# Patient Record
Sex: Male | Born: 1990 | Race: White | Hispanic: No | Marital: Single | Smoking: Current every day smoker
Health system: Southern US, Community
[De-identification: ages and names within clinical notes are randomized; demographics above are authoritative.]

## PROBLEM LIST (undated history)

## (undated) DIAGNOSIS — Z789 Other specified health status: Secondary | ICD-10-CM

## (undated) HISTORY — PX: APPENDECTOMY: SHX54

---

## 2012-09-20 ENCOUNTER — Emergency Department: Payer: Self-pay | Admitting: Emergency Medicine

## 2013-11-02 ENCOUNTER — Emergency Department: Payer: Self-pay | Admitting: Emergency Medicine

## 2018-09-19 ENCOUNTER — Inpatient Hospital Stay
Admission: EM | Admit: 2018-09-19 | Discharge: 2018-09-21 | DRG: 603 | Disposition: A | Discharge status: Home / Self Care | Payer: Self-pay | Attending: Internal Medicine | Admitting: Internal Medicine

## 2018-09-19 ENCOUNTER — Other Ambulatory Visit: Payer: Self-pay

## 2018-09-19 ENCOUNTER — Emergency Department: Payer: Self-pay

## 2018-09-19 ENCOUNTER — Encounter: Payer: Self-pay | Admitting: Emergency Medicine

## 2018-09-19 DIAGNOSIS — E872 Acidosis: Secondary | ICD-10-CM | POA: Diagnosis present

## 2018-09-19 DIAGNOSIS — Z20828 Contact with and (suspected) exposure to other viral communicable diseases: Secondary | ICD-10-CM | POA: Diagnosis present

## 2018-09-19 DIAGNOSIS — R739 Hyperglycemia, unspecified: Secondary | ICD-10-CM

## 2018-09-19 DIAGNOSIS — E781 Pure hyperglyceridemia: Secondary | ICD-10-CM | POA: Diagnosis present

## 2018-09-19 DIAGNOSIS — Z833 Family history of diabetes mellitus: Secondary | ICD-10-CM

## 2018-09-19 DIAGNOSIS — L03113 Cellulitis of right upper limb: Principal | ICD-10-CM | POA: Diagnosis present

## 2018-09-19 DIAGNOSIS — F17200 Nicotine dependence, unspecified, uncomplicated: Secondary | ICD-10-CM | POA: Diagnosis present

## 2018-09-19 HISTORY — DX: Other specified health status: Z78.9

## 2018-09-19 LAB — COMPREHENSIVE METABOLIC PANEL
ALT: 49 U/L — ABNORMAL HIGH (ref 0–44)
AST: 33 U/L (ref 15–41)
Albumin: 4.2 g/dL (ref 3.5–5.0)
Alkaline Phosphatase: 79 U/L (ref 38–126)
Anion gap: 16 — ABNORMAL HIGH (ref 5–15)
BUN: 15 mg/dL (ref 6–20)
CO2: 19 mmol/L — ABNORMAL LOW (ref 22–32)
Calcium: 9.3 mg/dL (ref 8.9–10.3)
Chloride: 101 mmol/L (ref 98–111)
Creatinine, Ser: 1.17 mg/dL (ref 0.61–1.24)
GFR calc Af Amer: >60 mL/min (ref >60)
GFR calc non Af Amer: >60 mL/min (ref >60)
Glucose, Bld: 209 mg/dL — ABNORMAL HIGH (ref 70–99)
Potassium: 4.3 mmol/L (ref 3.5–5.1)
Sodium: UNDETERMINED mmol/L (ref 135–145)
Total Bilirubin: 0.5 mg/dL (ref 0.3–1.2)
Total Protein: UNDETERMINED g/dL (ref 6.5–8.1)

## 2018-09-19 LAB — CBC WITH DIFFERENTIAL/PLATELET
Abs Immature Granulocytes: 0.02 10*3/uL (ref 0.00–0.07)
Basophils Absolute: 0.1 10*3/uL (ref 0.0–0.1)
Basophils Relative: 1 %
Eosinophils Absolute: 0.3 10*3/uL (ref 0.0–0.5)
Eosinophils Relative: 3 %
HCT: 44.7 % (ref 39.0–52.0)
Hemoglobin: 15.7 g/dL (ref 13.0–17.0)
Immature Granulocytes: 0 %
Lymphocytes Relative: 42 %
Lymphs Abs: 3.5 10*3/uL (ref 0.7–4.0)
MCH: 32.3 pg (ref 26.0–34.0)
MCHC: 35.1 g/dL (ref 30.0–36.0)
MCV: 92 fL (ref 80.0–100.0)
Monocytes Absolute: 0.6 10*3/uL (ref 0.1–1.0)
Monocytes Relative: 7 %
Neutro Abs: 3.9 10*3/uL (ref 1.7–7.7)
Neutrophils Relative %: 47 %
Platelets: 244 10*3/uL (ref 150–400)
RBC: 4.86 MIL/uL (ref 4.22–5.81)
RDW: 12.2 % (ref 11.5–15.5)
WBC: 8.3 10*3/uL (ref 4.0–10.5)
nRBC: 0 % (ref 0.0–0.2)

## 2018-09-19 LAB — LACTIC ACID, PLASMA
Lactic Acid, Venous: 2.6 mmol/L (ref 0.5–1.9)
Lactic Acid, Venous: 1.6 mmol/L (ref 0.5–1.9)

## 2018-09-19 MED ORDER — SODIUM CHLORIDE 0.9 % IV SOLN
2.0000 g | Freq: Once | INTRAVENOUS | Status: AC
  Administered 2018-09-19: 2 g via INTRAVENOUS
  Filled 2018-09-19: qty 20

## 2018-09-19 MED ORDER — KETOROLAC TROMETHAMINE 30 MG/ML IJ SOLN
15.0000 mg | Freq: Once | INTRAMUSCULAR | Status: AC
  Administered 2018-09-19: 15 mg via INTRAVENOUS
  Filled 2018-09-19: qty 1

## 2018-09-19 MED ORDER — HYDROMORPHONE HCL 1 MG/ML IJ SOLN
1.0000 mg | Freq: Once | INTRAMUSCULAR | Status: AC
  Administered 2018-09-19: 1 mg via INTRAVENOUS
  Filled 2018-09-19: qty 1

## 2018-09-19 MED ORDER — VANCOMYCIN HCL 10 G IV SOLR
2500.0000 mg | Freq: Once | INTRAVENOUS | Status: AC
  Administered 2018-09-19: 2500 mg via INTRAVENOUS
  Filled 2018-09-19: qty 2500

## 2018-09-19 MED ORDER — SODIUM CHLORIDE 0.9 % IV BOLUS
1000.0000 mL | Freq: Once | INTRAVENOUS | Status: AC
  Administered 2018-09-20: 1000 mL via INTRAVENOUS

## 2018-09-19 MED ORDER — LIDOCAINE-EPINEPHRINE 1 %-1:100000 IJ SOLN
10.0000 mL | Freq: Once | INTRAMUSCULAR | Status: AC
  Administered 2018-09-19: 10 mL via INTRADERMAL
  Filled 2018-09-19: qty 1

## 2018-09-19 NOTE — H&P (Signed)
[facility] [place] [facility]   PATIENT NAME: James Morgan    MR#:  [id number]  DATE OF BIRTH:  01/22/1991  DATE OF ADMISSION:  09/19/2018  PRIMARY CARE PHYSICIAN: Patient, No Pcp Per   REQUESTING/REFERRING PHYSICIAN: Erma HeritageIsaacs, MD  CHIEF COMPLAINT:  Right arm pain and swelling  HISTORY OF PRESENT ILLNESS:  James Morgan  is a 28 y.o. male who presents with chief complaint as above.  Patient presents the ED with 3 days of increasing pain at his elbow and swelling with erythema.  He states that just prior to his symptoms starting he was changing the oil in his car, and scraped his elbow.  He states that scabbed over and was fine for a day or 2, and then he started having some pain at that point, and then some erythema, and then some swelling.  Here in the ED he has noted cellulitis in the elbow  PAST MEDICAL HISTORY:   Past Medical History:  Diagnosis Date  . Patient denies medical problems      PAST SURGICAL HISTORY:   Past Surgical History:  Procedure Laterality Date  . APPENDECTOMY       SOCIAL HISTORY:   Social History   Tobacco Use  . Smoking status: Current Every Day Smoker  . Smokeless tobacco: Never Used  Substance Use Topics  . Alcohol use: Never    Frequency: Never     FAMILY HISTORY:    Family history reviewed and is non-contributory DRUG ALLERGIES:  No Known Allergies  MEDICATIONS AT HOME:   Prior to Admission medications   Not on File    REVIEW OF SYSTEMS:  Review of Systems  Constitutional: Negative for chills, fever, malaise/fatigue and weight loss.  HENT: Negative for ear pain, hearing loss and tinnitus.   Eyes: Negative for blurred vision, double vision, pain and redness.  Respiratory: Negative for cough, hemoptysis and shortness of breath.   Cardiovascular: Negative for chest pain, palpitations, orthopnea and leg swelling.  Gastrointestinal: Negative for abdominal pain, constipation, diarrhea, nausea  and vomiting.  Genitourinary: Negative for dysuria, frequency and hematuria.  Musculoskeletal: Negative for back pain, joint pain and neck pain.  Skin:       Right elbow and arm swelling with erythema  Neurological: Negative for dizziness, tremors, focal weakness and weakness.  Endo/Heme/Allergies: Negative for polydipsia. Does not bruise/bleed easily.  Psychiatric/Behavioral: Negative for depression. The patient is not nervous/anxious and does not have insomnia.      VITAL SIGNS:   Vitals:   09/19/18 1913 09/19/18 1914 09/19/18 2200  BP: (!) 149/81  (!) 172/94  Pulse: 71  65  Resp: 18  16  Temp: 98.3 F (36.8 C)    TempSrc: Oral    SpO2: 97%  97%  Weight:  127 kg   Height:  6\' 5"  (1.956 m)    Wt Readings from Last 3 Encounters:  09/19/18 127 kg    PHYSICAL EXAMINATION:  Physical Exam  Vitals reviewed. Constitutional: He is oriented to person, place, and time. He appears well-developed and well-nourished. No distress.  HENT:  Head: Normocephalic and atraumatic.  Mouth/Throat: Oropharynx is clear and moist.  Eyes: Pupils are equal, round, and reactive to light. Conjunctivae and EOM are normal. No scleral icterus.  Neck: Normal range of motion. Neck supple. No JVD present. No thyromegaly present.  Cardiovascular: Normal rate, regular rhythm and intact distal pulses. Exam reveals no gallop and no friction rub.  No murmur heard. Respiratory: Effort normal  and breath sounds normal. No respiratory distress. He has no wheezes. He has no rales.  GI: Soft. Bowel sounds are normal. He exhibits no distension. There is no abdominal tenderness.  Musculoskeletal: Normal range of motion.        General: No edema.     Comments: No arthritis, no gout  Lymphadenopathy:    He has no cervical adenopathy.  Neurological: He is alert and oriented to person, place, and time. No cranial nerve deficit.  No dysarthria, no aphasia  Skin: Skin is warm and dry. No rash noted. There is erythema  (Focused around his right elbow, with streaky spread up and down his arm, with swelling).  Psychiatric: He has a normal mood and affect. His behavior is normal. Judgment and thought content normal.    LABORATORY PANEL:   CBC Recent Labs  Lab 09/19/18 1921  WBC 8.3  HGB 15.7  HCT 44.7  PLT 244   ------------------------------------------------------------------------------------------------------------------  Chemistries  Recent Labs  Lab 09/19/18 1921  NA UNABLE TO REPORT DUE TO LIPEMIC INTERFERENCE  K 4.3  CL 101  CO2 19*  GLUCOSE 209*  BUN 15  CREATININE 1.17  CALCIUM 9.3  AST 33  ALT 49*  ALKPHOS 79  BILITOT 0.5   ------------------------------------------------------------------------------------------------------------------  Cardiac Enzymes No results for input(s): TROPONINI in the last 168 hours. ------------------------------------------------------------------------------------------------------------------  RADIOLOGY:  Dg Elbow Complete Right  Result Date: 09/19/2018 CLINICAL DATA:  Swelling and elbow pain EXAM: RIGHT ELBOW - COMPLETE 3+ VIEW COMPARISON:  None. FINDINGS: There is no evidence of fracture, dislocation, or joint effusion. There is no evidence of arthropathy or other focal bone abnormality. Soft tissue swelling over the posterior elbow. IMPRESSION: No acute osseous abnormality Electronically Signed   By: Donavan Foil M.D.   On: 09/19/2018 20:02    EKG:  No orders found for this or any previous visit.  IMPRESSION AND PLAN:  Principal Problem:   Right arm cellulitis -IV antibiotics initiated, orthopedic surgery consult to evaluate for any need for drainage or other intervention   Chart review performed and case discussed with ED provider. Labs, imaging and/or ECG reviewed by provider and discussed with patient/family. Management plans discussed with the patient and/or family.  COVID-19 status: Pending  DVT PROPHYLAXIS: SubQ lovenox    GI PROPHYLAXIS:  None  ADMISSION STATUS: Inpatient     CODE STATUS: Full  TOTAL TIME TAKING CARE OF THIS PATIENT: 45 minutes.   This patient was evaluated in the context of the global COVID-19 pandemic, which necessitated consideration that the patient might be at risk for infection with the SARS-CoV-2 virus that causes COVID-19. Institutional protocols and algorithms that pertain to the evaluation of patients at risk for COVID-19 are in a state of rapid change based on information released by regulatory bodies including the CDC and federal and state organizations. These policies and algorithms were followed to the best of this provider's knowledge to date during the patient's care at this facility.  Ethlyn Daniels 09/19/2018, 11:29 PM  [facility]  Office  [phone]  CC: Primary care physician; Patient, No Pcp Per  Note:  This document was prepared using Dragon voice recognition software and may include unintentional dictation errors.

## 2018-09-19 NOTE — Progress Notes (Signed)
PHARMACY -  BRIEF ANTIBIOTIC NOTE   Pharmacy has received consult(s) for Vancomycin from an ED provider.  The patient's profile has been reviewed for ht/wt/allergies/indication/available labs.    One time order(s) placed for Vancomycin 2 gm IV X 1.   Further antibiotics/pharmacy consults should be ordered by admitting physician if indicated.                       Thank you, Elliannah Wayment D 09/19/2018  8:55 PM

## 2018-09-19 NOTE — ED Provider Notes (Signed)
[facility] Emergency Department Provider Note  ____________________________________________   First MD Initiated Contact with Patient 09/19/18 2039     (approximate)  I have reviewed the triage vital signs and the nursing notes.   HISTORY  Chief Complaint No chief complaint on file.    HPI James Morgan is a 28 y.o. male  Here with R elbow pain.  Patient states that he hit his elbow while working on his car 2 to 3 weeks ago.  He initially had some mild pain that had resolved.  However, over the last week, he is developed progressively worsening redness, pain, and swelling of his right elbow.  He said difficulty moving his elbow due to this pain.  He said subjective chills.  He states the pain is rapidly spreading up his arm, and now it is severe.  He denies any drainage.  No numbness or weakness.  No history of MRSA or previous infections.  Does have a strong family history of diabetes but has no known personal history of diabetes.  Pain is worse with movement and palpation.       Past Medical History:  Diagnosis Date   Patient denies medical problems     Patient Active Problem List   Diagnosis Date Noted   Right arm cellulitis 09/19/2018    Past Surgical History:  Procedure Laterality Date   APPENDECTOMY      Prior to Admission medications   Not on File    Allergies Patient has no known allergies.  No family history on file.  Social History Social History   Tobacco Use   Smoking status: Current Every Day Smoker   Smokeless tobacco: Never Used  Substance Use Topics   Alcohol use: Never    Frequency: Never   Drug use: Never    Review of Systems  Review of Systems  Constitutional: Positive for fatigue. Negative for chills and fever.  HENT: Negative for sore throat.   Respiratory: Negative for shortness of breath.   Cardiovascular: Negative for chest pain.  Gastrointestinal: Negative for abdominal pain.    Genitourinary: Negative for flank pain.  Musculoskeletal: Positive for arthralgias and joint swelling. Negative for neck pain.  Skin: Positive for rash. Negative for wound.  Allergic/Immunologic: Negative for immunocompromised state.  Neurological: Negative for weakness and numbness.  Hematological: Does not bruise/bleed easily.  All other systems reviewed and are negative.    ____________________________________________  PHYSICAL EXAM:      VITAL SIGNS: ED Triage Vitals  Enc Vitals Group     BP 09/19/18 1913 (!) 149/81     Pulse Rate 09/19/18 1913 71     Resp 09/19/18 1913 18     Temp 09/19/18 1913 98.3 F (36.8 C)     Temp Source 09/19/18 1913 Oral     SpO2 09/19/18 1913 97 %     Weight 09/19/18 1914 280 lb (127 kg)     Height 09/19/18 1914 6\' 5"  (1.956 m)     Head Circumference --      Peak Flow --      Pain Score 09/19/18 1914 7     Pain Loc --      Pain Edu? --      Excl. in GC? --      Physical Exam Vitals signs and nursing note reviewed.  Constitutional:      General: He is not in acute distress.    Appearance: He is well-developed.  HENT:     Head:  Normocephalic and atraumatic.  Eyes:     Conjunctiva/sclera: Conjunctivae normal.  Neck:     Musculoskeletal: Neck supple.  Cardiovascular:     Rate and Rhythm: Normal rate and regular rhythm.     Heart sounds: Normal heart sounds. No murmur. No friction rub.  Pulmonary:     Effort: Pulmonary effort is normal. No respiratory distress.     Breath sounds: Normal breath sounds. No wheezing or rales.  Abdominal:     General: There is no distension.     Palpations: Abdomen is soft.     Tenderness: There is no abdominal tenderness.  Skin:    General: Skin is warm.     Capillary Refill: Capillary refill takes less than 2 seconds.  Neurological:     Mental Status: He is alert and oriented to person, place, and time.     Motor: No abnormal muscle tone.      UPPER EXTREMITY EXAM: RIGHT  INSPECTION &  PALPATION: Significant warmth and erythema with induration extending across the olecranon and over the elbow.  There is moderate swelling and fluctuance here.  Passive range of motion of the elbow is relatively painless, until full extension.  No drainage.  Superficial punctate wound over the elbow.  SENSORY: Sensation is intact to light touch in:  Superficial radial nerve distribution (dorsal first web space) Median nerve distribution (tip of index finger)   Ulnar nerve distribution (tip of small finger)     MOTOR:  + Motor posterior interosseous nerve (thumb IP extension) + Anterior interosseous nerve (thumb IP flexion, index finger DIP flexion) + Radial nerve (wrist extension) + Median nerve (palpable firing thenar mass) + Ulnar nerve (palpable firing of first dorsal interosseous muscle)  VASCULAR: 2+ radial pulse Brisk capillary refill < 2 sec, fingers warm and well-perfused   ____________________________________________   LABS (all labs ordered are listed, but only abnormal results are displayed)  Labs Reviewed  COMPREHENSIVE METABOLIC PANEL - Abnormal; Notable for the following components:      Result Value   CO2 19 (*)    Glucose, Bld 209 (*)    ALT 49 (*)    Anion gap 16 (*)    All other components within normal limits  LACTIC ACID, PLASMA - Abnormal; Notable for the following components:   Lactic Acid, Venous 2.6 (*)    All other components within normal limits  LIPID PANEL - Abnormal; Notable for the following components:   Cholesterol 270 (*)    Triglycerides 1,459 (*)    All other components within normal limits  TRIGLYCERIDES - Abnormal; Notable for the following components:   Triglycerides 1,459 (*)    All other components within normal limits  CULTURE, BLOOD (ROUTINE X 2)  CULTURE, BLOOD (ROUTINE X 2)  NOVEL CORONAVIRUS, NAA (HOSP ORDER, SEND-OUT TO REF LAB; TAT 18-24 HRS)  CBC WITH DIFFERENTIAL/PLATELET  LACTIC ACID, PLASMA  LDL CHOLESTEROL, DIRECT    HEMOGLOBIN A1C  HIV ANTIBODY (ROUTINE TESTING W REFLEX)  BASIC METABOLIC PANEL  CBC    ____________________________________________  EKG: None ________________________________________  RADIOLOGY All imaging, including plain films, CT scans, and ultrasounds, independently reviewed by me, and interpretations confirmed via formal radiology reads.  ED MD interpretation:   XR Elbow: No acute fx, no free air  Official radiology report(s): Dg Elbow Complete Right  Result Date: 09/19/2018 CLINICAL DATA:  Swelling and elbow pain EXAM: RIGHT ELBOW - COMPLETE 3+ VIEW COMPARISON:  None. FINDINGS: There is no evidence of fracture, dislocation, or joint  effusion. There is no evidence of arthropathy or other focal bone abnormality. Soft tissue swelling over the posterior elbow. IMPRESSION: No acute osseous abnormality Electronically Signed   By: Donavan Foil M.D.   On: 09/19/2018 20:02    ____________________________________________  PROCEDURES   Procedure(s) performed (including Critical Care):  Marland KitchenMarland KitchenIncision and Drainage  Date/Time: 09/20/2018 1:21 AM Performed by: Duffy Bruce, MD Authorized by: Duffy Bruce, MD   Consent:    Consent obtained:  Verbal   Consent given by:  Patient   Risks discussed:  Bleeding, damage to other organs, incomplete drainage, infection and pain   Alternatives discussed:  Alternative treatment and delayed treatment Location:    Type:  Bursa   Location:  Upper extremity   Upper extremity location:  Elbow   Elbow location:  R elbow Pre-procedure details:    Skin preparation:  Betadine Anesthesia (see MAR for exact dosages):    Anesthesia method:  Local infiltration   Local anesthetic:  Lidocaine 1% WITH epi Procedure type:    Complexity:  Simple Procedure details:    Needle aspiration: yes     Needle size:  18 G   Drainage:  Purulent   Drainage amount:  Scant   Wound treatment:  Wound left open Post-procedure details:    Patient tolerance  of procedure:  Tolerated well, no immediate complications    ____________________________________________  INITIAL IMPRESSION / MDM / ASSESSMENT AND PLAN / ED COURSE  As part of my medical decision making, I reviewed the following data within the electronic MEDICAL RECORD NUMBER Notes from prior ED visits and [place] Controlled Substance Database      *James Morgan was evaluated in Emergency Department on 09/20/2018 for the symptoms described in the history of present illness. He was evaluated in the context of the global COVID-19 pandemic, which necessitated consideration that the patient might be at risk for infection with the SARS-CoV-2 virus that causes COVID-19. Institutional protocols and algorithms that pertain to the evaluation of patients at risk for COVID-19 are in a state of rapid change based on information released by regulatory bodies including the CDC and federal and state organizations. These policies and algorithms were followed during the patient's care in the ED.  Some ED evaluations and interventions may be delayed as a result of limited staffing during the pandemic.*      Medical Decision Making: 28 year old male here with cellulitis of the right elbow and likely early septic bursitis.  Lactic acid elevated on arrival and he has had subjective chills and fevers at home.  Rocephin and vancomycin started.  Patient otherwise systemically well-appearing.  Of note, lab work shows marketed lipemia, hyperglycemia, and anion gap acidosis.  I suspect he likely has undiagnosed hyperlipidemia as well as diabetes.  Given this in addition to his lactic acidosis and close proximity to the right elbow with right-hand-dominant male, will admit for IV antibiotics.  Dr. Sabra Heck of orthopedics will see tomorrow. Attempted needle aspiration of area at bedside - small amount of purulence expressed but not enough to send for collection.  ____________________________________________  FINAL CLINICAL  IMPRESSION(S) / ED DIAGNOSES  Final diagnoses:  Right arm cellulitis  Hyperglycemia     MEDICATIONS GIVEN DURING THIS VISIT:  Medications  cefTRIAXone (ROCEPHIN) 2 g in sodium chloride 0.9 % 100 mL IVPB (has no administration in time range)  HYDROmorphone (DILAUDID) injection 0.5 mg (has no administration in time range)  ketorolac (TORADOL) 30 MG/ML injection 15 mg (has no administration in time range)  enoxaparin (LOVENOX) injection 40 mg (has no administration in time range)  acetaminophen (TYLENOL) tablet 650 mg (has no administration in time range)    Or  acetaminophen (TYLENOL) suppository 650 mg (has no administration in time range)  oxyCODONE (Oxy IR/ROXICODONE) immediate release tablet 5 mg (has no administration in time range)  ondansetron (ZOFRAN) tablet 4 mg (has no administration in time range)    Or  ondansetron (ZOFRAN) injection 4 mg (has no administration in time range)  vancomycin (VANCOCIN) 1,500 mg in sodium chloride 0.9 % 500 mL IVPB (has no administration in time range)  sodium chloride 0.9 % bolus 1,000 mL (1,000 mLs Intravenous New Bag/Given 09/20/18 0015)  lidocaine-EPINEPHrine (XYLOCAINE W/EPI) 1 %-1:100000 (with pres) injection 10 mL (10 mLs Intradermal Given by Other 09/19/18 2224)  cefTRIAXone (ROCEPHIN) 2 g in sodium chloride 0.9 % 100 mL IVPB (0 g Intravenous Stopped 09/19/18 2200)  sodium chloride 0.9 % bolus 1,000 mL (1,000 mLs Intravenous New Bag/Given 09/20/18 0015)  HYDROmorphone (DILAUDID) injection 1 mg (1 mg Intravenous Given 09/19/18 2117)  ketorolac (TORADOL) 30 MG/ML injection 15 mg (15 mg Intravenous Given 09/19/18 2117)  vancomycin (VANCOCIN) 2,500 mg in sodium chloride 0.9 % 500 mL IVPB (0 mg Intravenous Stopped 09/20/18 0011)     ED Discharge Orders    None       Note:  This document was prepared using Dragon voice recognition software and may include unintentional dictation errors.   Shaune PollackIsaacs, Tiwanda Threats, MD 09/20/18 [phone]

## 2018-09-19 NOTE — ED Triage Notes (Signed)
Patient ambulatory to triage with steady gait, without difficulty or distress noted, mask in place; St 3wks ago hit his rt elbow while working on a car, st small cut healed fine then Saturday began having pain/swelling/redness to elbow that increased today

## 2018-09-20 LAB — CBC
HCT: 41.3 % (ref 39.0–52.0)
Hemoglobin: 14.4 g/dL (ref 13.0–17.0)
MCH: 31.6 pg (ref 26.0–34.0)
MCHC: 34.9 g/dL (ref 30.0–36.0)
MCV: 90.6 fL (ref 80.0–100.0)
Platelets: 202 10*3/uL (ref 150–400)
RBC: 4.56 MIL/uL (ref 4.22–5.81)
RDW: 12.2 % (ref 11.5–15.5)
WBC: 8.6 10*3/uL (ref 4.0–10.5)
nRBC: 0 % (ref 0.0–0.2)

## 2018-09-20 LAB — BASIC METABOLIC PANEL
Anion gap: 9 (ref 5–15)
BUN: 14 mg/dL (ref 6–20)
CO2: 23 mmol/L (ref 22–32)
Calcium: 8.5 mg/dL — ABNORMAL LOW (ref 8.9–10.3)
Chloride: 107 mmol/L (ref 98–111)
Creatinine, Ser: 1.02 mg/dL (ref 0.61–1.24)
GFR calc Af Amer: >60 mL/min (ref >60)
GFR calc non Af Amer: >60 mL/min (ref >60)
Glucose, Bld: 167 mg/dL — ABNORMAL HIGH (ref 70–99)
Potassium: 3.8 mmol/L (ref 3.5–5.1)
Sodium: 139 mmol/L (ref 135–145)

## 2018-09-20 LAB — HEMOGLOBIN A1C
Hgb A1c MFr Bld: 7.1 % — ABNORMAL HIGH (ref 4.8–5.6)
Mean Plasma Glucose: 157.07 mg/dL

## 2018-09-20 LAB — LIPID PANEL
Cholesterol: 270 mg/dL — ABNORMAL HIGH (ref 0–200)
LDL Cholesterol: UNDETERMINED mg/dL (ref 0–99)
Triglycerides: 1459 mg/dL — ABNORMAL HIGH (ref <150)
VLDL: UNDETERMINED mg/dL (ref 0–40)

## 2018-09-20 LAB — TRIGLYCERIDES: Triglycerides: 1459 mg/dL — ABNORMAL HIGH (ref <150)

## 2018-09-20 LAB — LDL CHOLESTEROL, DIRECT: Direct LDL: UNDETERMINED mg/dL (ref 0–99)

## 2018-09-20 MED ORDER — ACETAMINOPHEN 325 MG PO TABS: 650.0000 mg | ORAL_TABLET | Freq: Four times a day (QID) | ORAL | Status: DC | PRN

## 2018-09-20 MED ORDER — FENOFIBRATE 160 MG PO TABS
160.0000 mg | ORAL_TABLET | Freq: Every day | ORAL | Status: DC
  Administered 2018-09-20 – 2018-09-21 (×2): 160 mg via ORAL
  Filled 2018-09-20 (×2): qty 1

## 2018-09-20 MED ORDER — VANCOMYCIN HCL 10 G IV SOLR
1750.0000 mg | Freq: Two times a day (BID) | INTRAVENOUS | Status: DC
  Administered 2018-09-20 – 2018-09-21 (×2): 1750 mg via INTRAVENOUS
  Filled 2018-09-20 (×3): qty 1750

## 2018-09-20 MED ORDER — VANCOMYCIN HCL 1.5 G IV SOLR
1500.0000 mg | Freq: Two times a day (BID) | INTRAVENOUS | Status: DC
  Administered 2018-09-20: 11:00:00 1500 mg via INTRAVENOUS
  Filled 2018-09-20 (×5): qty 1500

## 2018-09-20 MED ORDER — OXYCODONE HCL 5 MG PO TABS
5.0000 mg | ORAL_TABLET | ORAL | Status: DC | PRN
  Administered 2018-09-20 – 2018-09-21 (×6): 5 mg via ORAL
  Filled 2018-09-20 (×6): qty 1

## 2018-09-20 MED ORDER — ENOXAPARIN SODIUM 40 MG/0.4ML ~~LOC~~ SOLN
40.0000 mg | SUBCUTANEOUS | Status: DC
  Filled 2018-09-20 (×2): qty 0.4

## 2018-09-20 MED ORDER — SODIUM CHLORIDE 0.9 % IV SOLN
2.0000 g | INTRAVENOUS | Status: DC
  Administered 2018-09-20: 18:00:00 2 g via INTRAVENOUS
  Filled 2018-09-20: qty 2
  Filled 2018-09-20: qty 20

## 2018-09-20 MED ORDER — VANCOMYCIN HCL 10 G IV SOLR
1500.0000 mg | Freq: Two times a day (BID) | INTRAVENOUS | Status: DC
  Filled 2018-09-20 (×2): qty 1500

## 2018-09-20 MED ORDER — OMEGA-3-ACID ETHYL ESTERS 1 G PO CAPS
1.0000 g | ORAL_CAPSULE | Freq: Two times a day (BID) | ORAL | Status: DC
  Administered 2018-09-20 – 2018-09-21 (×2): 1 g via ORAL
  Filled 2018-09-20 (×2): qty 1

## 2018-09-20 MED ORDER — ONDANSETRON HCL 4 MG PO TABS
4.0000 mg | ORAL_TABLET | Freq: Four times a day (QID) | ORAL | Status: DC | PRN
  Administered 2018-09-20: 4 mg via ORAL
  Filled 2018-09-20: qty 1

## 2018-09-20 MED ORDER — KETOROLAC TROMETHAMINE 30 MG/ML IJ SOLN
15.0000 mg | Freq: Three times a day (TID) | INTRAMUSCULAR | Status: AC | PRN
  Administered 2018-09-20: 15 mg via INTRAVENOUS
  Filled 2018-09-20: qty 1

## 2018-09-20 MED ORDER — HYDROMORPHONE HCL 1 MG/ML IJ SOLN: 0.5000 mg | INTRAMUSCULAR | Status: DC | PRN

## 2018-09-20 MED ORDER — ACETAMINOPHEN 650 MG RE SUPP: 650.0000 mg | Freq: Four times a day (QID) | RECTAL | Status: DC | PRN

## 2018-09-20 MED ORDER — ONDANSETRON HCL 4 MG/2ML IJ SOLN: 4.0000 mg | Freq: Four times a day (QID) | INTRAMUSCULAR | Status: DC | PRN

## 2018-09-20 NOTE — ED Notes (Signed)
ED TO INPATIENT HANDOFF REPORT  ED Nurse Name and Phone #:  James BoomDaniel [phone]  S Name/Age/Gender James Morgan 28 y.o. male Room/Bed: ED12A/ED12A  Code Status   Code Status: Not on file  Home/SNF/Other Home Patient oriented to: self, place, time and situation Is this baseline? Yes   Triage Complete: Triage complete  Chief Complaint Elbow Pain  Triage Note Patient ambulatory to triage with steady gait, without difficulty or distress noted, mask in place; St 3wks ago hit his rt elbow while working on a car, st small cut healed fine then Saturday began having pain/swelling/redness to elbow that increased today   Allergies No Known Allergies  Level of Care/Admitting Diagnosis ED Disposition    ED Disposition Condition Comment   Admit  Hospital Area: [facility] [[id number]]  Level of Care: Med-Surg [16]  Covid Evaluation: Asymptomatic Screening Protocol (No Symptoms)  Diagnosis: Right arm cellulitis [[id number]][[id number]]  Admitting Physician: James ManisWILLIS, James Morgan [[id number]]  Attending Physician: James ManisWILLIS, James Morgan [[id number]]  Estimated length of stay: past midnight tomorrow  Certification:: I certify this patient will need inpatient services for at least 2 midnights  PT Class (Do Not Modify): Inpatient [101]  PT Acc Code (Do Not Modify): Private [1]       B Medical/Surgery History Past Medical History:  Diagnosis Date  . Patient denies medical problems    Past Surgical History:  Procedure Laterality Date  . APPENDECTOMY       A IV Location/Drains/Wounds Patient Lines/Drains/Airways Status   Active Line/Drains/Airways    Name:   Placement date:   Placement time:   Site:   Days:   Peripheral IV 09/19/18 Left Hand   09/19/18    2101    Hand   1          Intake/Output Last 24 hours  Intake/Output Summary (Last 24 hours) at 09/20/2018 0010 Last data filed at 09/19/2018 2200 Gross per 24 hour  Intake 100 ml  Output -  Net 100 ml    Labs/Imaging Results  for orders placed or performed during the hospital encounter of 09/19/18 (from the past 48 hour(s))  CBC with Differential     Status: None   Collection Time: 09/19/18  7:21 PM  Result Value Ref Range   WBC 8.3 4.0 - 10.5 K/uL   RBC 4.86 4.22 - 5.81 MIL/uL   Hemoglobin 15.7 13.0 - 17.0 g/dL   HCT 52.844.7 41.339.0 - 24.452.0 %   MCV 92.0 80.0 - 100.0 fL   MCH 32.3 26.0 - 34.0 pg   MCHC 35.1 30.0 - 36.0 g/dL   RDW 01.012.2 27.211.5 - 53.615.5 %   Platelets 244 150 - 400 K/uL   nRBC 0.0 0.0 - 0.2 %   Neutrophils Relative % 47 %   Neutro Abs 3.9 1.7 - 7.7 K/uL   Lymphocytes Relative 42 %   Lymphs Abs 3.5 0.7 - 4.0 K/uL   Monocytes Relative 7 %   Monocytes Absolute 0.6 0.1 - 1.0 K/uL   Eosinophils Relative 3 %   Eosinophils Absolute 0.3 0.0 - 0.5 K/uL   Basophils Relative 1 %   Basophils Absolute 0.1 0.0 - 0.1 K/uL   Immature Granulocytes 0 %   Abs Immature Granulocytes 0.02 0.00 - 0.07 K/uL    Comment: Performed at [facility], [address] [place], [place] [id number]  Comprehensive metabolic panel     Status: Abnormal   Collection Time: 09/19/18  7:21 PM  Result Value Ref Range  Sodium UNABLE TO REPORT DUE TO LIPEMIC INTERFERENCE 135 - 145 mmol/L    Comment: POST-ULTRACENTRIFUGATION   Potassium 4.3 3.5 - 5.1 mmol/L    Comment: POST-ULTRACENTRIFUGATION   Chloride 101 98 - 111 mmol/L    Comment: POST-ULTRACENTRIFUGATION   CO2 19 (L) 22 - 32 mmol/L    Comment: POST-ULTRACENTRIFUGATION   Glucose, Bld 209 (H) 70 - 99 mg/dL    Comment: POST-ULTRACENTRIFUGATION   BUN 15 6 - 20 mg/dL    Comment: POST-ULTRACENTRIFUGATION   Creatinine, Ser 1.17 0.61 - 1.24 mg/dL    Comment: POST-ULTRACENTRIFUGATION   Calcium 9.3 8.9 - 10.3 mg/dL    Comment: POST-ULTRACENTRIFUGATION   Total Protein UNABLE TO REPORT DUE TO LIPEMIC INTERFERENCE 6.5 - 8.1 g/dL    Comment: POST-ULTRACENTRIFUGATION   Albumin 4.2 3.5 - 5.0 g/dL    Comment: POST-ULTRACENTRIFUGATION   AST 33 15 - 41 U/L    Comment:  POST-ULTRACENTRIFUGATION   ALT 49 (H) 0 - 44 U/L    Comment: POST-ULTRACENTRIFUGATION   Alkaline Phosphatase 79 38 - 126 U/L    Comment: POST-ULTRACENTRIFUGATION   Total Bilirubin 0.5 0.3 - 1.2 mg/dL    Comment: POST-ULTRACENTRIFUGATION   GFR calc non Af Amer >60 >60 mL/min    Comment: POST-ULTRACENTRIFUGATION   GFR calc Af Amer >60 >60 mL/min    Comment: POST-ULTRACENTRIFUGATION   Anion gap 16 (H) 5 - 15    Comment: POST-ULTRACENTRIFUGATION Performed at [facility], [address] [place], [place] [address]   Lactic acid, plasma     Status: Abnormal   Collection Time: 09/19/18  7:21 PM  Result Value Ref Range   Lactic Acid, Venous 2.6 (HH) 0.5 - 1.9 mmol/L    Comment: CRITICAL RESULT CALLED TO, READ BACK BY AND VERIFIED WITH MEGAN JONES @2020  09/19/18 MJU Performed at [facility], [address] [place], [place] [address]   Lactic acid, plasma     Status: None   Collection Time: 09/19/18  9:50 PM  Result Value Ref Range   Lactic Acid, Venous 1.6 0.5 - 1.9 mmol/L    Comment: Performed at [facility], [address] [place],  [address]   Dg Elbow Complete Right  Result Date: 09/19/2018 CLINICAL DATA:  Swelling and elbow pain EXAM: RIGHT ELBOW - COMPLETE 3+ VIEW COMPARISON:  None. FINDINGS: There is no evidence of fracture, dislocation, or joint effusion. There is no evidence of arthropathy or other focal bone abnormality. Soft tissue swelling over the posterior elbow. IMPRESSION: No acute osseous abnormality Electronically Signed   By: Donavan Foil M.D.   On: 09/19/2018 20:02    Pending Labs Unresulted Labs (From admission, onward)    Start     Ordered   09/19/18 2256  Lipid panel  ONCE - STAT,   STAT     09/19/18 2255   09/19/18 2256  Triglycerides  ONCE - STAT,   STAT     09/19/18 2255   09/19/18 2256  Hemoglobin A1c  ONCE - STAT,   STAT     09/19/18 2255   09/19/18 1919  Blood culture (routine x 2)  BLOOD CULTURE X 2,   STAT      09/19/18 1918   Signed and Held  HIV antibody (Routine Testing)  Once,   R     Signed and Held   Signed and Held  CBC  (enoxaparin (LOVENOX)    CrCl >/= 30 ml/min)  Once,   R    Comments: Baseline for  enoxaparin therapy IF NOT ALREADY DRAWN.  Notify MD if PLT < 100 K.    Signed and Held   Signed and Held  Creatinine, serum  (enoxaparin (LOVENOX)    CrCl >/= 30 ml/min)  Once,   R    Comments: Baseline for enoxaparin therapy IF NOT ALREADY DRAWN.    Signed and Held   Signed and Held  Creatinine, serum  (enoxaparin (LOVENOX)    CrCl >/= 30 ml/min)  Weekly,   R    Comments: while on enoxaparin therapy    Signed and Held   Signed and Held  Basic metabolic panel  Tomorrow morning,   R     Signed and Held   Signed and Held  CBC  Tomorrow morning,   R     Signed and Held          Vitals/Pain Today's Vitals   09/19/18 1914 09/19/18 2110 09/19/18 2200 09/19/18 2200  BP:   (!) 172/94   Pulse:   65   Resp:   16   Temp:      TempSrc:      SpO2:   97%   Weight: 127 kg     Height: 6\' 5"  (1.956 m)     PainSc: 7  7   3      Isolation Precautions No active isolations  Medications Medications  sodium chloride 0.9 % bolus 1,000 mL (has no administration in time range)  sodium chloride 0.9 % bolus 1,000 mL (has no administration in time range)  vancomycin (VANCOCIN) 1,500 mg in sodium chloride 0.9 % 500 mL IVPB (has no administration in time range)  lidocaine-EPINEPHrine (XYLOCAINE W/EPI) 1 %-1:100000 (with pres) injection 10 mL (10 mLs Intradermal Given by Other 09/19/18 2224)  cefTRIAXone (ROCEPHIN) 2 g in sodium chloride 0.9 % 100 mL IVPB (0 g Intravenous Stopped 09/19/18 2200)  HYDROmorphone (DILAUDID) injection 1 mg (1 mg Intravenous Given 09/19/18 2117)  ketorolac (TORADOL) 30 MG/ML injection 15 mg (15 mg Intravenous Given 09/19/18 2117)  vancomycin (VANCOCIN) 2,500 mg in sodium chloride 0.9 % 500 mL IVPB (2,500 mg Intravenous New Bag/Given 09/19/18 2159)    Mobility walks Low fall  risk   Focused Assessments Neuro Assessment Handoff:  Swallow screen pass? N/A         Neuro Assessment:   Neuro Checks:      Last Documented NIHSS Modified Score:   Has TPA been given? No If patient is a Neuro Trauma and patient is going to OR before floor call report to 4N Charge nurse: [phone] or [phone]     R Recommendations: See Admitting Provider Note  Report given to:   Additional Notes:

## 2018-09-20 NOTE — Progress Notes (Addendum)
Pharmacy Antibiotic Note  James Morgan is a 28 y.o. male admitted on 09/19/2018 with sepsis s/t elbow cellulitis s/p elbow injury 3 wks ago.Marland Kitchen  Pharmacy has been consulted for vancomycin dosing.  Assessment/Plan: Maintenance dose of 1500 mg IV Q 12 hrs subtherapeutic, based on changes in predicted AUC.   Recommend: Vancomycin 1750 mg IV Q 12 hrs. Goal AUC 400-550. Expected AUC: 457.9 SCr used: 1.02 Cssmin: 11.1 Order Scr to check in the AM  Height: 6\' 5"  (195.6 cm) Weight: 290 lb 3.2 oz (131.6 kg) IBW/kg (Calculated) : 89.1  Temp (24hrs), Avg:98.3 F (36.8 C), Min:98.2 F (36.8 C), Max:98.5 F (36.9 C)  Recent Labs  Lab 09/19/18 1921 09/19/18 2150 09/20/18 0447  WBC 8.3  --  8.6  CREATININE 1.17  --  1.02  LATICACIDVEN 2.6* 1.6  --     Estimated Creatinine Clearance: 160.4 mL/min (by C-G formula based on SCr of 1.02 mg/dL).    No Known Allergies  Dose Adjustments this Admission Vanc 1500 mg IV BID  Antimicrobials this Admission Ceftriaxone 8/24>> Vancomycin 8/24>>  Thank you for allowing pharmacy to be a part of this patient's care.  Gerald Dexter, PharmD Pharmacy Resident  09/20/2018 11:14 AM

## 2018-09-20 NOTE — ED Notes (Signed)
Attempted to call report. Was told to call back in ten minutes.

## 2018-09-20 NOTE — Progress Notes (Signed)
[facility] -  at [facility]                                                                                                                                                                                  Patient Demographics   James Morgan, is a [age] y.o. male, DOB - 08/17/1990, ZOX:[id number]  Admit date - 09/19/2018   Admitting Physician Oralia Manisavid Willis, MD  Outpatient Primary MD for the patient is Patient, No Pcp Per   LOS - 1  Subjective: Right arm swelling worse than the elbow patient has good range of motion   Review of Systems:   CONSTITUTIONAL: No documented fever. No fatigue, weakness. No weight gain, no weight loss.  EYES: No blurry or double vision.  ENT: No tinnitus. No postnasal drip. No redness of the oropharynx.  RESPIRATORY: No cough, no wheeze, no hemoptysis. No dyspnea.  CARDIOVASCULAR: No chest pain. No orthopnea. No palpitations. No syncope.  GASTROINTESTINAL: No nausea, no vomiting or diarrhea. No abdominal pain. No melena or hematochezia.  GENITOURINARY: No dysuria or hematuria.  ENDOCRINE: No polyuria or nocturia. No heat or cold intolerance.  HEMATOLOGY: No anemia. No bruising. No bleeding.  INTEGUMENTARY: No rashes. No lesions.  MUSCULOSKELETAL: No arthritis. No swelling. No gout.  NEUROLOGIC: No numbness, tingling, or ataxia. No seizure-type activity.  PSYCHIATRIC: No anxiety. No insomnia. No ADD.    Vitals:   Vitals:   09/19/18 2200 09/20/18 0000 09/20/18 0101 09/20/18 0801  BP: (!) 172/94 (!) 157/85 (!) 160/83 (!) 161/96  Pulse: 65 68 71 69  Resp: 16 17 14 18   Temp:   98.5 F (36.9 C) 98.2 F (36.8 C)  TempSrc:   Oral Oral  SpO2: 97% 97% 100% 100%  Weight:   131.6 kg   Height:   6\' 5"  (1.956 m)     Wt Readings from Last 3 Encounters:  09/20/18 131.6 kg     Intake/Output Summary (Last 24 hours) at 09/20/2018 1312 Last data filed at 09/20/2018 0200 Gross per 24 hour  Intake 2598 ml  Output -  Net 2598 ml     Physical Exam:   GENERAL: Pleasant-appearing in no apparent distress.  HEAD, EYES, EARS, NOSE AND THROAT: Atraumatic, normocephalic. Extraocular muscles are intact. Pupils equal and reactive to light. Sclerae anicteric. No conjunctival injection. No oro-pharyngeal erythema.  NECK: Supple. There is no jugular venous distention. No bruits, no lymphadenopathy, no thyromegaly.  HEART: Regular rate and rhythm,. No murmurs, no rubs, no clicks.  LUNGS: Clear to auscultation bilaterally. No rales or rhonchi. No wheezes.  ABDOMEN: Soft, flat, nontender, nondistended. Has good bowel sounds. No hepatosplenomegaly appreciated.  EXTREMITIES: No evidence of any  cyanosis, clubbing, or peripheral edema.  +2 pedal and radial pulses bilaterally.  NEUROLOGIC: The patient is alert, awake, and oriented x3 with no focal motor or sensory deficits appreciated bilaterally.  SKIN: Moist and warm with no rashes appreciated.  Right elbow swelling Psych: Not anxious, depressed LN: No inguinal LN enlargement    Antibiotics   Anti-infectives (From admission, onward)   Start     Dose/Rate Route Frequency Ordered Stop   09/20/18 1800  cefTRIAXone (ROCEPHIN) 2 g in sodium chloride 0.9 % 100 mL IVPB     2 g 200 mL/hr over 30 Minutes Intravenous Every 24 hours 09/20/18 0113     09/20/18 1000  vancomycin (VANCOCIN) 1,500 mg in sodium chloride 0.9 % 500 mL IVPB  Status:  Discontinued     1,500 mg 250 mL/hr over 120 Minutes Intravenous Every 12 hours 09/20/18 0001 09/20/18 0839   09/20/18 1000  vancomycin (VANCOCIN) 1,500 mg in sodium chloride 0.9 % 500 mL IVPB     1,500 mg 250 mL/hr over 120 Minutes Intravenous Every 12 hours 09/20/18 0839     09/19/18 2100  cefTRIAXone (ROCEPHIN) 2 g in sodium chloride 0.9 % 100 mL IVPB     2 g 200 mL/hr over 30 Minutes Intravenous  Once 09/19/18 2049 09/19/18 2200   09/19/18 2100  vancomycin (VANCOCIN) 2,500 mg in sodium chloride 0.9 % 500 mL IVPB     2,500 mg 250 mL/hr over 120  Minutes Intravenous  Once 09/19/18 2055 09/20/18 0011      Medications   Scheduled Meds: . enoxaparin (LOVENOX) injection  40 mg Subcutaneous Q24H   Continuous Infusions: . cefTRIAXone (ROCEPHIN)  IV    . vancomycin 1,500 mg (09/20/18 1128)   PRN Meds:.acetaminophen **OR** acetaminophen, HYDROmorphone (DILAUDID) injection, ketorolac, ondansetron **OR** ondansetron (ZOFRAN) IV, oxyCODONE   Data Review:   Micro Results Recent Results (from the past 240 hour(s))  Blood culture (routine x 2)     Status: None (Preliminary result)   Collection Time: 09/19/18  7:21 PM   Specimen: BLOOD  Result Value Ref Range Status   Specimen Description BLOOD LAC  Final   Special Requests   Final    BOTTLES DRAWN AEROBIC AND ANAEROBIC Blood Culture results may not be optimal due to an excessive volume of blood received in culture bottles   Culture   Final    NO GROWTH < 12 HOURS Performed at [facility], [address] [place], [place] [address]    Report Status PENDING  Incomplete  Blood culture (routine x 2)     Status: None (Preliminary result)   Collection Time: 09/19/18  8:59 PM   Specimen: BLOOD  Result Value Ref Range Status   Specimen Description BLOOD LEFT FATTY CASTS  Final   Special Requests   Final    BOTTLES DRAWN AEROBIC AND ANAEROBIC Blood Culture results may not be optimal due to an excessive volume of blood received in culture bottles   Culture   Final    NO GROWTH < 12 HOURS Performed at [facility], [address] [place], [place] [address]    Report Status PENDING  Incomplete    Radiology Reports Dg Elbow Complete Right  Result Date: 09/19/2018 CLINICAL DATA:  Swelling and elbow pain EXAM: RIGHT ELBOW - COMPLETE 3+ VIEW COMPARISON:  None. FINDINGS: There is no evidence of fracture, dislocation, or joint effusion. There is no evidence of arthropathy or other focal bone abnormality. Soft tissue swelling over the posterior  elbow. IMPRESSION: No  acute osseous abnormality Electronically Signed   By: Donavan Foil M.D.   On: 09/19/2018 20:02     CBC Recent Labs  Lab 09/19/18 1921 09/20/18 0447  WBC 8.3 8.6  HGB 15.7 14.4  HCT 44.7 41.3  PLT 244 202  MCV 92.0 90.6  MCH 32.3 31.6  MCHC 35.1 34.9  RDW 12.2 12.2  LYMPHSABS 3.5  --   MONOABS 0.6  --   EOSABS 0.3  --   BASOSABS 0.1  --     Chemistries  Recent Labs  Lab 09/19/18 1921 09/20/18 0447  NA UNABLE TO REPORT DUE TO LIPEMIC INTERFERENCE 139  K 4.3 3.8  CL 101 107  CO2 19* 23  GLUCOSE 209* 167*  BUN 15 14  CREATININE 1.17 1.02  CALCIUM 9.3 8.5*  AST 33  --   ALT 49*  --   ALKPHOS 79  --   BILITOT 0.5  --    ------------------------------------------------------------------------------------------------------------------ estimated creatinine clearance is 160.4 mL/min (by C-G formula based on SCr of 1.02 mg/dL). ------------------------------------------------------------------------------------------------------------------ Recent Labs    09/19/18 2317  HGBA1C 7.1*   ------------------------------------------------------------------------------------------------------------------ Recent Labs    09/19/18 2317  CHOL 270*  HDL NOT REPORTED DUE TO HIGH TRIGLYCERIDES  LDLCALC UNABLE TO CALCULATE IF TRIGLYCERIDE OVER 400 mg/dL  TRIG 1,459*  1,459*  CHOLHDL NOT REPORTED DUE TO HIGH TRIGLYCERIDES  LDLDIRECT UNABLE TO CALCULATE IF TRIGLYCERIDE IS >1293 mg/dL   ------------------------------------------------------------------------------------------------------------------ No results for input(s): TSH, T4TOTAL, T3FREE, THYROIDAB in the last 72 hours.  Invalid input(s): FREET3 ------------------------------------------------------------------------------------------------------------------ No results for input(s): VITAMINB12, FOLATE, FERRITIN, TIBC, IRON, RETICCTPCT in the last 72 hours.  Coagulation profile No results for input(s): INR, PROTIME in the  last 168 hours.  No results for input(s): DDIMER in the last 72 hours.  Cardiac Enzymes No results for input(s): CKMB, TROPONINI, MYOGLOBIN in the last 168 hours.  Invalid input(s): CK ------------------------------------------------------------------------------------------------------------------ Invalid input(s): [place]  Patient is 28 year old with right arm swelling and redness  1.  Right arm cellulitis continue IV antibiotics as per orthopedics no plan for any intervention for now  2.  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 Misc: lovenox for dvt proph       Code Status Orders  (From admission, onward)         Start     Ordered   09/20/18 0113  Full code  Continuous     09/20/18 0113        Code Status History    This patient has a current code status but no historical code status.   Advance Care Planning Activity           Consults orthopedics  DVT Prophylaxis  Lovenox   Lab Results  Component Value Date   PLT 202 09/20/2018     Time Spent in minutes  81min  Greater than 50% of time spent in care coordination and counseling patient regarding the condition and plan of care.   Dustin Flock M.D on 09/20/2018 at 1:12 PM  Between 7am to 6pm - Pager - [phone]  After 6pm go to [email or web address] - Proofreader  [employer]   Office  [phone]

## 2018-09-20 NOTE — Consult Note (Signed)
ORTHOPAEDIC CONSULTATION  REQUESTING PHYSICIAN: Auburn BilberryPatel, Shreyang, MD  Chief Complaint: Right arm pain  HPI: James Morgan is a 28 y.o. male who complains of right arm pain and swelling.  About 2 weeks ago he hit the elbow on something and had a small wound.  It seemed to be healing but in the last 48 hours it is gotten red angry and painful.  He presented to the emergency room last night and was admitted for IV antibiotics.  White blood count was normal and temperature was normal.  He is feeling a little better this morning.  Past Medical History:  Diagnosis Date  . Patient denies medical problems    Past Surgical History:  Procedure Laterality Date  . APPENDECTOMY     Social History   Socioeconomic History  . Marital status: Single    Spouse name: Not on file  . Number of children: Not on file  . Years of education: Not on file  . Highest education level: Not on file  Occupational History  . Not on file  Social Needs  . Financial resource strain: Not on file  . Food insecurity    Worry: Not on file    Inability: Not on file  . Transportation needs    Medical: Not on file    Non-medical: Not on file  Tobacco Use  . Smoking status: Current Every Day Smoker  . Smokeless tobacco: Never Used  Substance and Sexual Activity  . Alcohol use: Never    Frequency: Never  . Drug use: Never  . Sexual activity: Not on file  Lifestyle  . Physical activity    Days per week: Not on file    Minutes per session: Not on file  . Stress: Not on file  Relationships  . Social Musicianconnections    Talks on phone: Not on file    Gets together: Not on file    Attends religious service: Not on file    Active member of club or organization: Not on file    Attends meetings of clubs or organizations: Not on file    Relationship status: Not on file  Other Topics Concern  . Not on file  Social History Narrative  . Not on file   No family history on file. No Known Allergies Prior to  Admission medications   Not on File   Dg Elbow Complete Right  Result Date: 09/19/2018 CLINICAL DATA:  Swelling and elbow pain EXAM: RIGHT ELBOW - COMPLETE 3+ VIEW COMPARISON:  None. FINDINGS: There is no evidence of fracture, dislocation, or joint effusion. There is no evidence of arthropathy or other focal bone abnormality. Soft tissue swelling over the posterior elbow. IMPRESSION: No acute osseous abnormality Electronically Signed   By: Jasmine PangKim  Fujinaga M.D.   On: 09/19/2018 20:02    Positive ROS: All other systems have been reviewed and were otherwise negative with the exception of those mentioned in the HPI and as above.  Physical Exam: General: Alert, no acute distress Cardiovascular: No pedal edema Respiratory: No cyanosis, no use of accessory musculature GI: No organomegaly, abdomen is soft and non-tender Skin: No lesions in the area of chief complaint Neurologic: Sensation intact distally Psychiatric: Patient is competent for consent with normal mood and affect Lymphatic: No axillary or cervical lymphadenopathy  MUSCULOSKELETAL: Right arm and at the elbow and forearm is slightly swollen.  Slightly red.  Slightly warm.  Mall punctate wound at the tip of the olecranon.  There is no real free  fluid in the bursa.  Range of motion is slightly restricted by pain.  Neurovascular status good distally.  Does not appear especially angry.  Assessment: Cellulitis right forearm and elbow  Plan: I do not see any need for irrigation debridement anywhere. Continue IV antibiotics for 24 hours. Should be able to change him to p.o. antibiotics and discharge tomorrow. I will see him in my office on Friday.    Park Breed, MD [phone]   09/20/2018 9:26 AM

## 2018-09-20 NOTE — Progress Notes (Signed)
Pharmacy Antibiotic Note  James Morgan is a 28 y.o. male admitted on 09/19/2018 with sepsis s/t elbow cellulitis s/p elbow injury 3 wks ago.Marland Kitchen  Pharmacy has been consulted for vancomycin dosing.  Plan: Patient received vanc 2.5g load and ceftriaxone 2g IV x 1 in ED  Vancomycin 1500 mg IV Q 12 hrs. Goal AUC 400-550. Expected AUC: 463.8 SCr used: 1.17 Cssmin: 11.5  Height: 6\' 5"  (195.6 cm) Weight: 280 lb (127 kg) IBW/kg (Calculated) : 89.1  Temp (24hrs), Avg:98.3 F (36.8 C), Min:98.3 F (36.8 C), Max:98.3 F (36.8 C)  Recent Labs  Lab 09/19/18 1921 09/19/18 2150  WBC 8.3  --   CREATININE 1.17  --   LATICACIDVEN 2.6* 1.6    Estimated Creatinine Clearance: 137.4 mL/min (by C-G formula based on SCr of 1.17 mg/dL).    No Known Allergies  Thank you for allowing pharmacy to be a part of this patient's care.  Tobie Lords, PharmD, BCPS Clinical Pharmacist 09/20/2018 12:02 AM

## 2018-09-21 LAB — BASIC METABOLIC PANEL
Anion gap: 7 (ref 5–15)
BUN: 11 mg/dL (ref 6–20)
CO2: 26 mmol/L (ref 22–32)
Calcium: 8.8 mg/dL — ABNORMAL LOW (ref 8.9–10.3)
Chloride: 107 mmol/L (ref 98–111)
Creatinine, Ser: 0.92 mg/dL (ref 0.61–1.24)
GFR calc Af Amer: >60 mL/min (ref >60)
GFR calc non Af Amer: >60 mL/min (ref >60)
Glucose, Bld: 138 mg/dL — ABNORMAL HIGH (ref 70–99)
Potassium: 3.9 mmol/L (ref 3.5–5.1)
Sodium: 140 mmol/L (ref 135–145)

## 2018-09-21 LAB — CBC
HCT: 42.2 % (ref 39.0–52.0)
Hemoglobin: 14.6 g/dL (ref 13.0–17.0)
MCH: 31.1 pg (ref 26.0–34.0)
MCHC: 34.6 g/dL (ref 30.0–36.0)
MCV: 90 fL (ref 80.0–100.0)
Platelets: 212 10*3/uL (ref 150–400)
RBC: 4.69 MIL/uL (ref 4.22–5.81)
RDW: 12.2 % (ref 11.5–15.5)
WBC: 7.4 10*3/uL (ref 4.0–10.5)
nRBC: 0 % (ref 0.0–0.2)

## 2018-09-21 LAB — LIPID PANEL
Cholesterol: 231 mg/dL — ABNORMAL HIGH (ref 0–200)
HDL: 25 mg/dL — ABNORMAL LOW (ref >40)
LDL Cholesterol: UNDETERMINED mg/dL (ref 0–99)
Total CHOL/HDL Ratio: 9.2 RATIO
Triglycerides: 866 mg/dL — ABNORMAL HIGH (ref <150)
VLDL: UNDETERMINED mg/dL (ref 0–40)

## 2018-09-21 LAB — HIV ANTIBODY (ROUTINE TESTING W REFLEX): HIV Screen 4th Generation wRfx: NONREACTIVE

## 2018-09-21 LAB — LDL CHOLESTEROL, DIRECT: Direct LDL: 78.1 mg/dL (ref 0–99)

## 2018-09-21 LAB — NOVEL CORONAVIRUS, NAA (HOSP ORDER, SEND-OUT TO REF LAB; TAT 18-24 HRS): SARS-CoV-2, NAA: NOT DETECTED

## 2018-09-21 MED ORDER — OMEGA-3-ACID ETHYL ESTERS 1 G PO CAPS: 1.0000 g | ORAL_CAPSULE | Freq: Two times a day (BID) | ORAL | 0 refills | Status: AC

## 2018-09-21 MED ORDER — FENOFIBRATE 160 MG PO TABS: 160.0000 mg | ORAL_TABLET | Freq: Every day | ORAL | 0 refills | Status: AC

## 2018-09-21 MED ORDER — ACETAMINOPHEN 325 MG PO TABS: 650.0000 mg | ORAL_TABLET | Freq: Four times a day (QID) | ORAL | Status: AC | PRN

## 2018-09-21 MED ORDER — AMOXICILLIN-POT CLAVULANATE 875-125 MG PO TABS: 1.0000 | ORAL_TABLET | Freq: Two times a day (BID) | ORAL | 0 refills | Status: AC

## 2018-09-22 NOTE — Discharge Summary (Signed)
[facility] - [place] at [facility], [place] y.o[place] DOB April 02, 1990, MRN [id number]. Admission date: 09/19/2018 Discharge Date 09/22/2018 Primary MD Patient, No Pcp Per Admitting Physician Oralia Manis, MD  Admission Diagnosis  Elbow Pain  Discharge Diagnosis   Principal Problem:   Right arm cellulitis    Hypertriglyceridemia Diabetes type 2       Hospital Course  James Morgan  is a 28 y.o. male who presents with chief complaint as above.  Patient presents the ED with 3 days of increasing pain at his elbow and swelling with erythema.  He states that just prior to his symptoms starting he was changing the oil in his car, and scraped his elbow.  He states that scabbed over and was fine for a day or 2, and then he started having some pain at that point, and then some erythema, and then some swelling.  Here in the ED he has noted cellulitis in the elbow.  Patient was admitted by orthopedics did not need any drain.  With IV antibiotics his swelling went down            Consults  orthopedic surgery  Significant Tests:  See full reports for all details     Dg Elbow Complete Right  Result Date: 09/19/2018 CLINICAL DATA:  Swelling and elbow pain EXAM: RIGHT ELBOW - COMPLETE 3+ VIEW COMPARISON:  None. FINDINGS: There is no evidence of fracture, dislocation, or joint effusion. There is no evidence of arthropathy or other focal bone abnormality. Soft tissue swelling over the posterior elbow. IMPRESSION: No acute osseous abnormality Electronically Signed   By: Jasmine Pang M.D.   On: 09/19/2018 20:02       Today   Subjective:   James Morgan  Pt doing well swelling mostly resolved  Objective:   Blood pressure (!) 144/78, pulse (!) 52, temperature 98.3 F (36.8 C), temperature source Oral, resp. rate 18, height 6\' 5"  (1.956 m), weight 131.6 kg, SpO2 96 %.  . No intake or output data in the 24 hours ending 09/22/18 1643  Exam VITAL SIGNS: Blood  pressure (!) 144/78, pulse (!) 52, temperature 98.3 F (36.8 C), temperature source Oral, resp. rate 18, height 6\' 5"  (1.956 m), weight 131.6 kg, SpO2 96 %.  GENERAL:  28 y.o.-year-old patient lying in the bed with no acute distress.  EYES: Pupils equal, round, reactive to light and accommodation. No scleral icterus. Extraocular muscles intact.  HEENT: Head atraumatic, normocephalic. Oropharynx and nasopharynx clear.  NECK:  Supple, no jugular venous distention. No thyroid enlargement, no tenderness.  LUNGS: Normal breath sounds bilaterally, no wheezing, rales,rhonchi or crepitation. No use of accessory muscles of respiration.  CARDIOVASCULAR: S1, S2 normal. No murmurs, rubs, or gallops.  ABDOMEN: Soft, nontender, nondistended. Bowel sounds present. No organomegaly or mass.  EXTREMITIES: No pedal edema, cyanosis, or clubbing.  NEUROLOGIC: Cranial nerves II through XII are intact. Muscle strength 5/5 in all extremities. Sensation intact. Gait not checked.  PSYCHIATRIC: The patient is alert and oriented x 3.  SKIN: No obvious rash, lesion, or ulcer.   Data Review     CBC w Diff:  Lab Results  Component Value Date   WBC 7.4 09/21/2018   HGB 14.6 09/21/2018   HCT 42.2 09/21/2018   PLT 212 09/21/2018   LYMPHOPCT 42 09/19/2018   MONOPCT 7 09/19/2018   EOSPCT 3 09/19/2018   BASOPCT 1 09/19/2018   CMP:  Lab Results  Component Value Date   NA  140 09/21/2018   K 3.9 09/21/2018   CL 107 09/21/2018   CO2 26 09/21/2018   BUN 11 09/21/2018   CREATININE 0.92 09/21/2018   PROT UNABLE TO REPORT DUE TO LIPEMIC INTERFERENCE 09/19/2018   ALBUMIN 4.2 09/19/2018   BILITOT 0.5 09/19/2018   ALKPHOS 79 09/19/2018   AST 33 09/19/2018   ALT 49 (H) 09/19/2018  .  Micro Results Recent Results (from the past 240 hour(s))  Blood culture (routine x 2)     Status: None (Preliminary result)   Collection Time: 09/19/18  7:21 PM   Specimen: BLOOD  Result Value Ref Range Status   Specimen  Description BLOOD LAC  Final   Special Requests   Final    BOTTLES DRAWN AEROBIC AND ANAEROBIC Blood Culture results may not be optimal due to an excessive volume of blood received in culture bottles   Culture   Final    NO GROWTH 3 DAYS Performed at [facility], [address], [place], [place] [address]    Report Status PENDING  Incomplete  Blood culture (routine x 2)     Status: None (Preliminary result)   Collection Time: 09/19/18  8:59 PM   Specimen: BLOOD  Result Value Ref Range Status   Specimen Description BLOOD LEFT FATTY CASTS  Final   Special Requests   Final    BOTTLES DRAWN AEROBIC AND ANAEROBIC Blood Culture results may not be optimal due to an excessive volume of blood received in culture bottles   Culture   Final    NO GROWTH 3 DAYS Performed at [facility], [address], [place], [place] [address]    Report Status PENDING  Incomplete  Novel Coronavirus, NAA (Hosp order, Send-out to Ref Lab; TAT 18-24 hrs     Status: None   Collection Time: 09/20/18  1:18 AM   Specimen: Nasopharyngeal Swab; Respiratory  Result Value Ref Range Status   SARS-CoV-2, NAA NOT DETECTED NOT DETECTED Final    Comment: (NOTE) This test was developed and its performance characteristics determined by [employer]. This test has not been FDA cleared or approved. This test has been authorized by FDA under an Emergency Use Authorization (EUA). This test is only authorized for the duration of time the declaration that circumstances exist justifying the authorization of the emergency use of in vitro diagnostic tests for detection of SARS-CoV-2 virus and/or diagnosis of COVID-19 infection under section 564(b)(1) of the Act, 21 U.S.C. 253GUY-4(I)(3), unless the authorization is terminated or revoked sooner. When diagnostic testing is negative, the possibility of a false negative result should be considered in the context of a patient's recent exposures and the  presence of clinical signs and symptoms consistent with COVID-19. An individual without symptoms of COVID-19 and who is not shedding SARS-CoV-2 virus would expect to have a negative (not detected) result in this assay. Performed  At: [facility] [address] [place], [place] [id number] Rush Farmer MD OV:[id number]    [place]  Final    Comment: Performed at [facility], [address] [place], [place] [address]     Code Status History    Date Active Date Inactive Code Status Order ID Comments User Context   09/20/2018 0113 09/21/2018 1534 Full Code [id number]  Lance Coon, MD Inpatient   Advance Care Planning Activity          Follow-up Information    Earnestine Leys, MD. Schedule an appointment as soon as possible for a visit  on 09/26/2018.   Specialty: Orthopedic Surgery Why: @ 1:30 pm Contact information: [address] [place] [address] [phone]           Discharge Medications   Allergies as of 09/21/2018   No Known Allergies     Medication List    TAKE these medications   acetaminophen 325 MG tablet Commonly known as: TYLENOL Take 2 tablets (650 mg total) by mouth every 6 (six) hours as needed for mild pain (or Fever >/= 101).   amoxicillin-clavulanate 875-125 MG tablet Commonly known as: Augmentin Take 1 tablet by mouth 2 (two) times daily for 7 days.   fenofibrate 160 MG tablet Take 1 tablet (160 mg total) by mouth daily.   omega-3 acid ethyl esters 1 g capsule Commonly known as: LOVAZA Take 1 capsule (1 g total) by mouth 2 (two) times daily.          Total Time in preparing paper work, data evaluation and todays exam - 35 minutes  Auburn BilberryShreyang Briar Witherspoon M.D on 09/22/2018 at 4:43 PM [facility]   Office  [phone]

## 2018-09-24 LAB — CULTURE, BLOOD (ROUTINE X 2)
Culture: NO GROWTH
Culture: NO GROWTH

## 2019-05-17 ENCOUNTER — Other Ambulatory Visit: Payer: Self-pay

## 2019-05-17 ENCOUNTER — Emergency Department
Admission: EM | Admit: 2019-05-17 | Discharge: 2019-05-17 | Disposition: A | Discharge status: Home / Self Care | Payer: Self-pay | Attending: Student in an Organized Health Care Education/Training Program | Admitting: Student in an Organized Health Care Education/Training Program

## 2019-05-17 DIAGNOSIS — Z79899 Other long term (current) drug therapy: Secondary | ICD-10-CM | POA: Insufficient documentation

## 2019-05-17 DIAGNOSIS — S61212A Laceration without foreign body of right middle finger without damage to nail, initial encounter: Secondary | ICD-10-CM | POA: Insufficient documentation

## 2019-05-17 DIAGNOSIS — Y929 Unspecified place or not applicable: Secondary | ICD-10-CM | POA: Insufficient documentation

## 2019-05-17 DIAGNOSIS — Y939 Activity, unspecified: Secondary | ICD-10-CM | POA: Insufficient documentation

## 2019-05-17 DIAGNOSIS — F17200 Nicotine dependence, unspecified, uncomplicated: Secondary | ICD-10-CM | POA: Insufficient documentation

## 2019-05-17 DIAGNOSIS — S6991XA Unspecified injury of right wrist, hand and finger(s), initial encounter: Secondary | ICD-10-CM

## 2019-05-17 DIAGNOSIS — Y999 Unspecified external cause status: Secondary | ICD-10-CM | POA: Insufficient documentation

## 2019-05-17 DIAGNOSIS — W230XXA Caught, crushed, jammed, or pinched between moving objects, initial encounter: Secondary | ICD-10-CM | POA: Insufficient documentation

## 2019-05-17 MED ORDER — LIDOCAINE HCL (PF) 1 % IJ SOLN
5.0000 mL | Freq: Once | INTRAMUSCULAR | Status: AC
  Administered 2019-05-17: 21:00:00 5 mL
  Filled 2019-05-17: qty 5

## 2019-05-17 NOTE — ED Notes (Signed)
See triage note- pt reports he was rolling wood today and caught his finger. Middle right finger has wound and skin pulled back from nail.

## 2019-05-17 NOTE — ED Triage Notes (Signed)
Pt states he was moving something and his hand slipped and pulled the skin back from the nail bed of the right middle finger.

## 2019-05-17 NOTE — Discharge Instructions (Addendum)
Keep the wound clean, dry, and covered. See a local Urgent Care Center for suture removal in 7-10 days.

## 2019-05-17 NOTE — ED Provider Notes (Signed)
[facility] Emergency Department Provider Note ____________________________________________  Time seen: 1944  I have reviewed the triage vital signs and the nursing notes.  HISTORY  Chief Complaint  Finger Injury  HPI James Morgan is a 29 y.o. male presents to the ED for evaluation of accidental laceration to the cuticle of the right middle finger.  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  Patient presents with a cuticle laceration.  He denies any disability or dysfunction to the right middle finger.  He also denies any concern for a crush injury to the finger.  No nailbed injury or subungual hematoma is reported.  Patient denies any other injury at this time.  He reports a current tetanus status.   Past Medical History:  Diagnosis Date  . Patient denies medical problems     Patient Active Problem List   Diagnosis Date Noted  . Right arm cellulitis 09/19/2018    Past Surgical History:  Procedure Laterality Date  . APPENDECTOMY      Prior to Admission medications   Medication Sig Start Date End Date Taking? Authorizing Provider  acetaminophen (TYLENOL) 325 MG tablet Take 2 tablets (650 mg total) by mouth every 6 (six) hours as needed for mild pain (or Fever >/= 101). 09/21/18   Auburn Bilberry, MD  fenofibrate 160 MG tablet Take 1 tablet (160 mg total) by mouth daily. 09/21/18   Auburn Bilberry, MD  omega-3 acid ethyl esters (LOVAZA) 1 g capsule Take 1 capsule (1 g total) by mouth 2 (two) times daily. 09/21/18   Auburn Bilberry, MD    Allergies Patient has no known allergies.  History reviewed. No pertinent family history.  Social History Social History   Tobacco Use  . Smoking status: Current Every Day Smoker  . Smokeless tobacco: Never Used  Substance Use Topics  . Alcohol use: Never  . Drug use: Never    Review of  Systems  Constitutional: Negative for fever. Cardiovascular: Negative for chest pain. Respiratory: Negative for shortness of breath. Musculoskeletal: Negative for back pain. Skin: Negative for rash. Right long (middle) finger laceration  Neurological: Negative for headaches, focal weakness or numbness. ____________________________________________  PHYSICAL EXAM:  VITAL SIGNS: ED Triage Vitals  Enc Vitals Group     BP 05/17/19 1823 (!) 152/85     Pulse Rate 05/17/19 1823 88     Resp 05/17/19 1823 18     Temp 05/17/19 1823 98.7 F (37.1 C)     Temp Source 05/17/19 1823 Oral     SpO2 05/17/19 1823 97 %     Weight 05/17/19 1820 280 lb (127 kg)     Height 05/17/19 1820 6\' 5"  (1.956 m)     Head Circumference --      Peak Flow --      Pain Score 05/17/19 1820 7     Pain Loc --      Pain Edu? --      Excl. in GC? --     Constitutional: Alert and oriented. Well appearing and in no distress. Head: Normocephalic and atraumatic. Eyes: Conjunctivae are normal. Normal extraocular movements Cardiovascular: Normal rate, regular rhythm. Normal distal pulses. Respiratory: Normal respiratory effort.  Musculoskeletal: Normal composite fist on the right.  No deformity dislocation, or dysfunction to the right middle finger.  Patient with a flap laceration to the dorsal cuticle of the right middle finger.  No nail bed injury, avulsion, or subungual hematoma is noted.  Nontender with normal range of motion in all extremities.  Neurologic:  Normal gait without ataxia. Normal speech and language. No gross focal neurologic deficits are appreciated. Skin:  Skin is warm, dry and intact. No rash noted. ____________________________________________  PROCEDURES  .Marland KitchenLaceration Repair  Date/Time: 05/17/2019 7:41 PM Performed by: Melvenia Needles, PA-C Authorized by: Melvenia Needles, PA-C   Consent:    Consent obtained:  Verbal   Consent given by:  Patient   Risks discussed:   Pain Anesthesia (see MAR for exact dosages):    Anesthesia method:  Local infiltration   Local anesthetic:  Lidocaine 1% w/o epi Laceration details:    Location:  Finger   Finger location:  R long finger   Length (cm):  1   Depth (mm):  5 Repair type:    Repair type:  Simple Pre-procedure details:    Preparation:  Patient was prepped and draped in usual sterile fashion Treatment:    Area cleansed with:  Betadine   Amount of cleaning:  Standard Skin repair:    Repair method:  Sutures   Suture size:  5-0   Suture material:  Nylon   Suture technique:  Simple interrupted   Number of sutures:  2 Approximation:    Approximation:  Close Post-procedure details:    Dressing:  Non-adherent dressing and bulky dressing   Patient tolerance of procedure:  Tolerated well, no immediate complications   ____________________________________________  INITIAL IMPRESSION / ASSESSMENT AND PLAN / ED COURSE  Patient with ED evaluation of an accidental laceration of the cuticle of the right middle finger.  The flap laceration is repaired using 2 nylon sutures.  Patient is discharged with wound care instructions and supplies.  He will follow-up with primary provider or local urgent care for suture removal in 7 to 10 days.  SAID RUEB was evaluated in Emergency Department on 05/17/2019 for the symptoms described in the history of present illness. He was evaluated in the context of the global COVID-19 pandemic, which necessitated consideration that the patient might be at risk for infection with the SARS-CoV-2 virus that causes COVID-19. Institutional protocols and algorithms that pertain to the evaluation of patients at risk for COVID-19 are in a state of rapid change based on information released by regulatory bodies including the CDC and federal and state organizations. These policies and algorithms were followed during the patient's care in the  ED. ____________________________________________  FINAL CLINICAL IMPRESSION(S) / ED DIAGNOSES  Final diagnoses:  Injury of finger of right hand, initial encounter  Laceration of right middle finger without foreign body without damage to nail, initial encounter      Melvenia Needles, PA-C 05/19/19 1009    Merlyn Lot, MD 05/19/19 1504

## 2019-09-14 ENCOUNTER — Emergency Department
Admission: EM | Admit: 2019-09-14 | Discharge: 2019-09-14 | Disposition: A | Discharge status: Home / Self Care | Payer: Self-pay | Attending: Emergency Medicine | Admitting: Emergency Medicine

## 2019-09-14 ENCOUNTER — Other Ambulatory Visit: Payer: Self-pay

## 2019-09-14 DIAGNOSIS — Y998 Other external cause status: Secondary | ICD-10-CM | POA: Insufficient documentation

## 2019-09-14 DIAGNOSIS — Y26XXXA Exposure to smoke, fire and flames, undetermined intent, initial encounter: Secondary | ICD-10-CM | POA: Insufficient documentation

## 2019-09-14 DIAGNOSIS — T22231A Burn of second degree of right upper arm, initial encounter: Secondary | ICD-10-CM | POA: Insufficient documentation

## 2019-09-14 DIAGNOSIS — T2220XA Burn of second degree of shoulder and upper limb, except wrist and hand, unspecified site, initial encounter: Secondary | ICD-10-CM

## 2019-09-14 DIAGNOSIS — Y939 Activity, unspecified: Secondary | ICD-10-CM | POA: Insufficient documentation

## 2019-09-14 DIAGNOSIS — F17200 Nicotine dependence, unspecified, uncomplicated: Secondary | ICD-10-CM | POA: Insufficient documentation

## 2019-09-14 DIAGNOSIS — Y929 Unspecified place or not applicable: Secondary | ICD-10-CM | POA: Insufficient documentation

## 2019-09-14 MED ORDER — SILVER SULFADIAZINE 1 % EX CREA: TOPICAL_CREAM | CUTANEOUS | 1 refills | Status: AC

## 2019-09-14 MED ORDER — TRAMADOL HCL 50 MG PO TABS: 50.0000 mg | ORAL_TABLET | Freq: Four times a day (QID) | ORAL | 0 refills | Status: AC | PRN

## 2019-09-14 MED ORDER — TRAMADOL HCL 50 MG PO TABS
50.0000 mg | ORAL_TABLET | Freq: Once | ORAL | Status: AC
  Administered 2019-09-14: 50 mg via ORAL
  Filled 2019-09-14: qty 1

## 2019-09-14 MED ORDER — SILVER SULFADIAZINE 1 % EX CREA: TOPICAL_CREAM | Freq: Once | CUTANEOUS | Status: AC

## 2019-09-14 NOTE — Discharge Instructions (Addendum)
Follow discharge care instruction use medications as directed.
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
## 2020-08-01 ENCOUNTER — Emergency Department: Payer: Self-pay

## 2020-08-01 ENCOUNTER — Emergency Department
Admission: EM | Admit: 2020-08-01 | Discharge: 2020-08-01 | Disposition: A | Discharge status: Home / Self Care | Payer: Self-pay | Attending: Emergency Medicine | Admitting: Emergency Medicine

## 2020-08-01 ENCOUNTER — Other Ambulatory Visit: Payer: Self-pay

## 2020-08-01 ENCOUNTER — Encounter: Payer: Self-pay | Admitting: Emergency Medicine

## 2020-08-01 DIAGNOSIS — F17200 Nicotine dependence, unspecified, uncomplicated: Secondary | ICD-10-CM | POA: Insufficient documentation

## 2020-08-01 DIAGNOSIS — L02413 Cutaneous abscess of right upper limb: Secondary | ICD-10-CM | POA: Insufficient documentation

## 2020-08-01 MED ORDER — SULFAMETHOXAZOLE-TRIMETHOPRIM 800-160 MG PO TABS: 1.0000 | ORAL_TABLET | Freq: Two times a day (BID) | ORAL | 0 refills | Status: AC

## 2020-08-01 MED ORDER — SULFAMETHOXAZOLE-TRIMETHOPRIM 800-160 MG PO TABS
1.0000 | ORAL_TABLET | Freq: Once | ORAL | Status: AC
  Administered 2020-08-01: 1 via ORAL
  Filled 2020-08-01: qty 1

## 2020-08-01 MED ORDER — LIDOCAINE-EPINEPHRINE 1 %-1:100000 IJ SOLN
30.0000 mL | Freq: Once | INTRAMUSCULAR | Status: AC
  Administered 2020-08-01: 30 mL

## 2020-08-01 MED ORDER — LIDOCAINE-PRILOCAINE 2.5-2.5 % EX CREA: TOPICAL_CREAM | Freq: Once | CUTANEOUS | Status: DC

## 2020-08-01 NOTE — ED Provider Notes (Signed)
[facility] EMERGENCY DEPARTMENT Provider Note   CSN: [id number] Arrival date & time: 08/01/20  1455     History Chief Complaint  Patient presents with   Arm Pain    James Morgan is a 30 y.o. male.  Presents to the emergency department for evaluation of bump to the right forearm James Morgan performs tree work, few weeks ago noticed a bump along the ulnar aspect of the forearm.  James Morgan drained this with a small needle and was able to remove some pus.  James Morgan states his tetanus is up-to-date.  Based on the type of work James Morgan does as possible there could have been a puncture wound/possible foreign body.  Denies any fevers, swelling  HPI     Past Medical History:  Diagnosis Date   Patient denies medical problems     Patient Active Problem List   Diagnosis Date Noted   Right arm cellulitis 09/19/2018    Past Surgical History:  Procedure Laterality Date   APPENDECTOMY         No family history on file.  Social History   Tobacco Use   Smoking status: Every Day    Pack years: 0.00   Smokeless tobacco: Never  Substance Use Topics   Alcohol use: Never   Drug use: Never    Home Medications Prior to Admission medications   Medication Sig Start Date End Date Taking? Authorizing Provider  acetaminophen (TYLENOL) 325 MG tablet Take 2 tablets (650 mg total) by mouth every 6 (six) hours as needed for mild pain (or Fever >/= 101). 09/21/18   Auburn Bilberry, MD  fenofibrate 160 MG tablet Take 1 tablet (160 mg total) by mouth daily. 09/21/18   Auburn Bilberry, MD  omega-3 acid ethyl esters (LOVAZA) 1 g capsule Take 1 capsule (1 g total) by mouth 2 (two) times daily. 09/21/18   Auburn Bilberry, MD  silver sulfADIAZINE (SILVADENE) 1 % cream Apply to affected area daily 09/14/19   Joni Reining, PA-C  traMADol (ULTRAM) 50 MG tablet Take 1 tablet (50 mg total) by mouth every 6 (six) hours as needed. 09/14/19 09/13/20  Joni Reining, PA-C    Allergies    Patient has no  known allergies.  Review of Systems   Review of Systems  Constitutional:  Negative for chills and fever.  Musculoskeletal:  Negative for arthralgias and joint swelling.  Skin:  Positive for wound. Negative for color change and rash.  Neurological:  Negative for headaches.   Physical Exam Updated Vital Signs BP 122/80 (BP Location: Left Arm)   Pulse 80   Temp 98.6 F (37 C) (Oral)   Resp 20   Ht 6\' 5"  (1.956 m)   Wt 120.2 kg   SpO2 98%   BMI 31.42 kg/m   Physical Exam Constitutional:      Appearance: James Morgan is well-developed.  HENT:     Head: Normocephalic and atraumatic.  Eyes:     Conjunctiva/sclera: Conjunctivae normal.  Cardiovascular:     Rate and Rhythm: Normal rate.  Pulmonary:     Effort: Pulmonary effort is normal. No respiratory distress.  Musculoskeletal:        General: Normal range of motion.     Cervical back: Normal range of motion.     Comments: Right upper extremity with normal range of motion.  Small 2 x 2 centimeter area of induration and soft tissue swelling along the mid aspect of the forearm just over the ulna shaft.  There  is mild surrounding erythema with no fluctuance.  No active drainage.  No palpable foreign body.  No significant surrounding cellulitis  Skin:    General: Skin is warm.     Findings: No rash.  Neurological:     General: No focal deficit present.     Mental Status: James Morgan is alert and oriented to person, place, and time.  Psychiatric:        Behavior: Behavior normal.        Thought Content: Thought content normal.    ED Results / Procedures / Treatments   Labs (all labs ordered are listed, but only abnormal results are displayed) Labs Reviewed - No data to display  EKG None  Radiology No results found.  Procedures .Marland KitchenIncision and Drainage  Date/Time: 08/01/2020 6:07 PM Performed by: Evon Slack, PA-C Authorized by: Evon Slack, PA-C   Consent:    Consent obtained:  Verbal   Consent given by:   Patient Universal protocol:    Patient identity confirmed:  Verbally with patient Location:    Type:  Abscess   Location:  Upper extremity   Upper extremity location:  Arm   Arm location:  R lower arm Pre-procedure details:    Skin preparation:  Chlorhexidine Anesthesia:    Anesthesia method:  Local infiltration   Local anesthetic:  Lidocaine 1% WITH epi Procedure type:    Complexity:  Simple Procedure details:    Incision types:  Stab incision   Incision depth:  Subcutaneous   Drainage:  Purulent   Drainage amount:  Scant   Wound treatment:  Wound left open Post-procedure details:    Procedure completion:  Tolerated well, no immediate complications   Medications Ordered in ED Medications  lidocaine-prilocaine (EMLA) cream (has no administration in time range)  sulfamethoxazole-trimethoprim (BACTRIM DS) 800-160 MG per tablet 1 tablet (has no administration in time range)    ED Course  I have reviewed the triage vital signs and the nursing notes.  Pertinent labs & imaging results that were available during my care of the patient were reviewed by me and considered in my medical decision making (see chart for details).    MDM Rules/Calculators/A&P                         30 year old male with indurated abscess along the right mid forearm.  X-rays show no radiopaque foreign body.  Wound suspicious for abscess, I&D performed with minimal purulent drainage expressed.  No significant surrounding cellulitis.  Patient educated on wound care and is placed on antibiotics.  James Morgan understands signs symptoms return to the ER or follow-up PCP for. Final Clinical Impression(s) / ED Diagnoses Final diagnoses:  Abscess of right forearm    Rx / DC Orders ED Discharge Orders     None        Ronnette Juniper 08/01/20 Erick Colace, MD 08/06/20 1734

## 2020-08-01 NOTE — ED Triage Notes (Signed)
Pt reports that a few days ago he cut right arm and has been doing OTC remedies to it. He reports that he feels like it is getting red and swollen. He had to be admitted to the hospital last time he cut himself. Minimal swelling and redness to arm.

## 2020-08-01 NOTE — Discharge Instructions (Addendum)
Please change Band-Aid daily and apply warm compresses daily.  May shower and get wet but do not submerge underwater.  Take antibiotics as prescribed.  Return to the ER for any increased warmth redness or drainage.  If continued swelling/pain and no improvement 1 week recommend follow-up with PCP or orthopedics.

## 2021-07-19 ENCOUNTER — Emergency Department
Admission: EM | Admit: 2021-07-19 | Discharge: 2021-07-19 | Disposition: A | Discharge status: Home / Self Care | Payer: Commercial Managed Care - PPO | Attending: Emergency Medicine | Admitting: Emergency Medicine

## 2021-07-19 ENCOUNTER — Emergency Department: Payer: Commercial Managed Care - PPO

## 2021-07-19 ENCOUNTER — Other Ambulatory Visit: Payer: Self-pay

## 2021-07-19 ENCOUNTER — Encounter: Payer: Self-pay | Admitting: Emergency Medicine

## 2021-07-19 DIAGNOSIS — S0003XA Contusion of scalp, initial encounter: Secondary | ICD-10-CM | POA: Diagnosis not present

## 2021-07-19 DIAGNOSIS — Y990 Civilian activity done for income or pay: Secondary | ICD-10-CM | POA: Diagnosis not present

## 2021-07-19 DIAGNOSIS — R739 Hyperglycemia, unspecified: Secondary | ICD-10-CM | POA: Insufficient documentation

## 2021-07-19 DIAGNOSIS — S1081XA Abrasion of other specified part of neck, initial encounter: Secondary | ICD-10-CM | POA: Diagnosis not present

## 2021-07-19 DIAGNOSIS — S40211A Abrasion of right shoulder, initial encounter: Secondary | ICD-10-CM | POA: Diagnosis not present

## 2021-07-19 DIAGNOSIS — S0093XA Contusion of unspecified part of head, initial encounter: Secondary | ICD-10-CM

## 2021-07-19 DIAGNOSIS — S0990XA Unspecified injury of head, initial encounter: Secondary | ICD-10-CM

## 2021-07-19 DIAGNOSIS — W228XXA Striking against or struck by other objects, initial encounter: Secondary | ICD-10-CM | POA: Diagnosis not present

## 2021-07-19 LAB — COMPREHENSIVE METABOLIC PANEL
ALT: 50 U/L — ABNORMAL HIGH (ref 0–44)
AST: 36 U/L (ref 15–41)
Albumin: 3.7 g/dL (ref 3.5–5.0)
Alkaline Phosphatase: 83 U/L (ref 38–126)
Anion gap: 11 (ref 5–15)
BUN: 12 mg/dL (ref 6–20)
CO2: 24 mmol/L (ref 22–32)
Calcium: 9.4 mg/dL (ref 8.9–10.3)
Chloride: 99 mmol/L (ref 98–111)
Creatinine, Ser: 1.11 mg/dL (ref 0.61–1.24)
GFR, Estimated: >60 mL/min (ref >60)
Glucose, Bld: 368 mg/dL — ABNORMAL HIGH (ref 70–99)
Potassium: 3.6 mmol/L (ref 3.5–5.1)
Sodium: 134 mmol/L — ABNORMAL LOW (ref 135–145)
Total Bilirubin: 0.6 mg/dL (ref 0.3–1.2)
Total Protein: 6.5 g/dL (ref 6.5–8.1)

## 2021-07-19 LAB — CBC WITH DIFFERENTIAL/PLATELET
Abs Immature Granulocytes: 0.03 10*3/uL (ref 0.00–0.07)
Basophils Absolute: 0 10*3/uL (ref 0.0–0.1)
Basophils Relative: 1 %
Eosinophils Absolute: 0.2 10*3/uL (ref 0.0–0.5)
Eosinophils Relative: 3 %
HCT: 43.6 % (ref 39.0–52.0)
Hemoglobin: 15.7 g/dL (ref 13.0–17.0)
Immature Granulocytes: 0 %
Lymphocytes Relative: 34 %
Lymphs Abs: 2.8 10*3/uL (ref 0.7–4.0)
MCH: 32.2 pg (ref 26.0–34.0)
MCHC: 36 g/dL (ref 30.0–36.0)
MCV: 89.3 fL (ref 80.0–100.0)
Monocytes Absolute: 0.7 10*3/uL (ref 0.1–1.0)
Monocytes Relative: 9 %
Neutro Abs: 4.6 10*3/uL (ref 1.7–7.7)
Neutrophils Relative %: 53 %
Platelets: 256 10*3/uL (ref 150–400)
RBC: 4.88 MIL/uL (ref 4.22–5.81)
RDW: 12.1 % (ref 11.5–15.5)
WBC: 8.5 10*3/uL (ref 4.0–10.5)
nRBC: 0 % (ref 0.0–0.2)

## 2021-07-19 MED ORDER — ONDANSETRON 4 MG PO TBDP: 4.0000 mg | ORAL_TABLET | Freq: Three times a day (TID) | ORAL | 0 refills | Status: AC | PRN

## 2021-07-19 MED ORDER — ONDANSETRON 4 MG PO TBDP
4.0000 mg | ORAL_TABLET | Freq: Once | ORAL | Status: AC
  Administered 2021-07-19: 4 mg via ORAL
  Filled 2021-07-19: qty 1

## 2021-07-19 MED ORDER — HYDROCODONE-ACETAMINOPHEN 5-325 MG PO TABS
2.0000 | ORAL_TABLET | Freq: Once | ORAL | Status: AC
  Administered 2021-07-19: 2 via ORAL
  Filled 2021-07-19: qty 2

## 2021-07-19 NOTE — ED Notes (Signed)
Pt standing in doorway asking if he can leave. Pt endorses he's hungry. This Clinical research associate has offered patient something to drink and food. Pt declined. Dr. Erma Heritage made aware.

## 2021-08-29 ENCOUNTER — Telehealth: Payer: Self-pay

## 2021-08-29 NOTE — Telephone Encounter (Signed)
Left vm to confirm 09/04/21 appointment-Toni 

## 2021-09-04 ENCOUNTER — Ambulatory Visit: Payer: Self-pay | Admitting: Physician Assistant

## 2022-10-01 IMAGING — DX DG FOREARM 2V*R*
2 series · 2 of 2 positions shown · non-contrast
Comparison: None.

CLINICAL DATA: Wound.  Rule out foreign body. cut right arm

EXAM:
RIGHT FOREARM - 2 VIEW

[forearm ap]
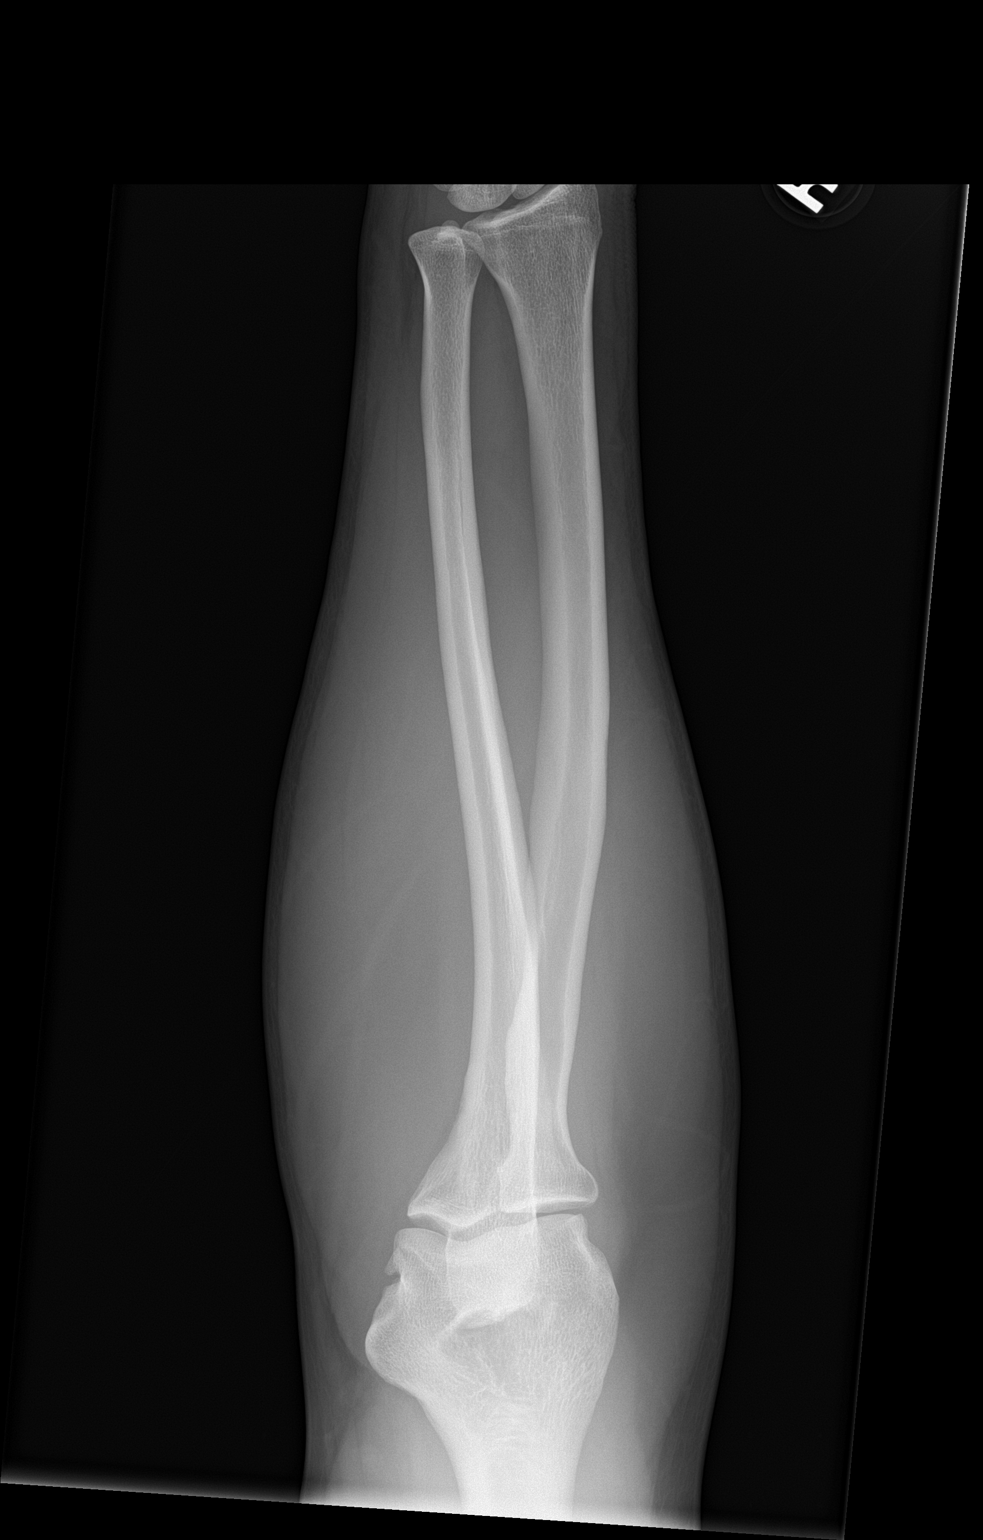

[forearm lat]
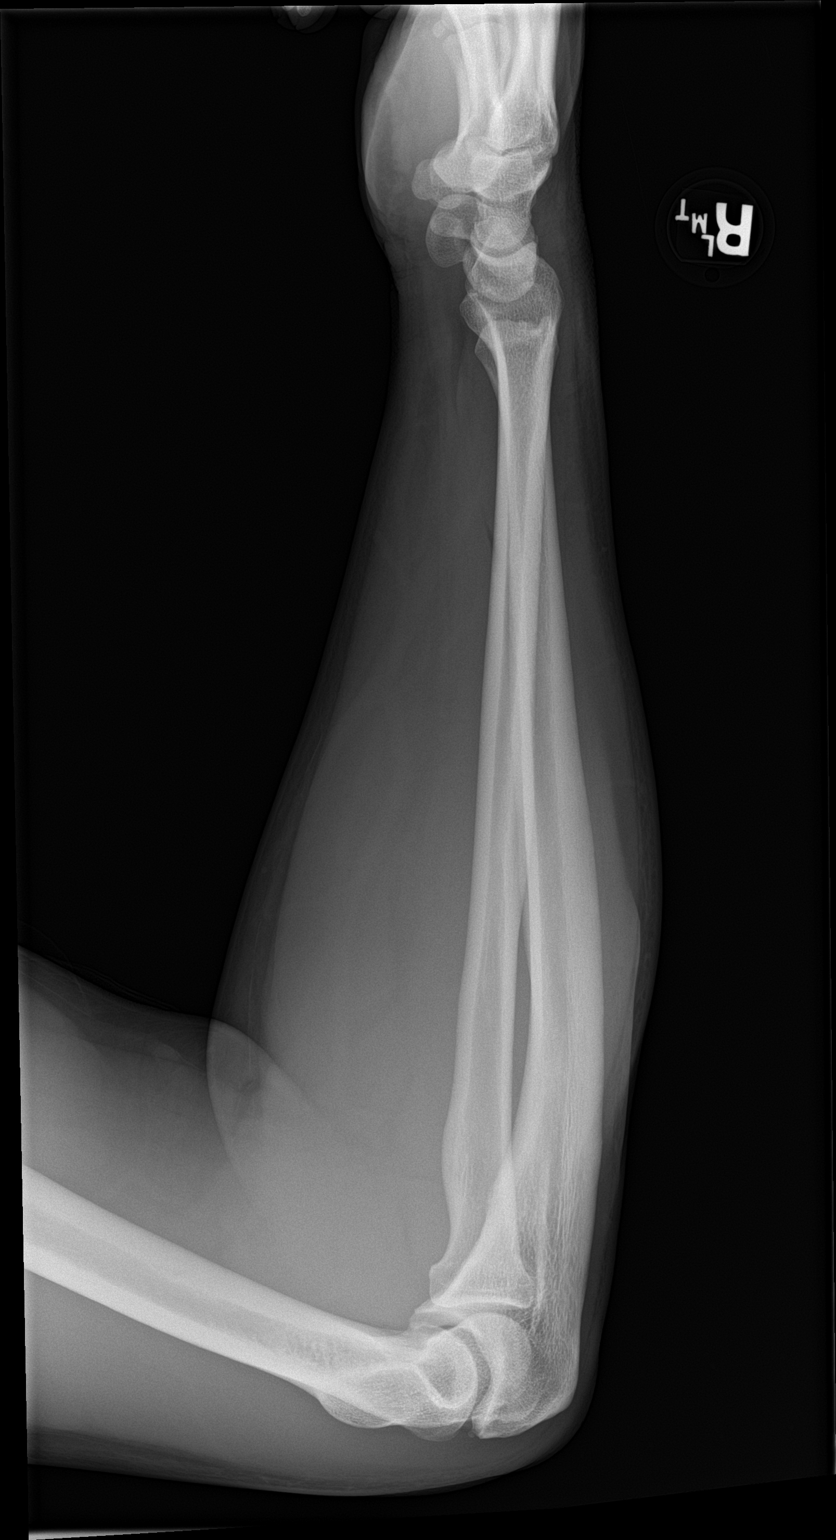

[2 of 2 positions shown; findings below may reference images not displayed]

FINDINGS: There is no evidence of fracture or other focal bone lesions. Mild
subcutaneus soft tissue edema. No no retained radiopaque foreign
body.
IMPRESSION: 1. No retained radiopaque foreign body.
2. No acute displaced fracture or dislocation.
